# Patient Record
Sex: Male | Born: 1945 | Race: White | Hispanic: No | Marital: Married | Smoking: Former smoker
Health system: Southern US, Community
[De-identification: ages and names within clinical notes are randomized; demographics above are authoritative.]

## PROBLEM LIST (undated history)

## (undated) DIAGNOSIS — I639 Cerebral infarction, unspecified: Secondary | ICD-10-CM

## (undated) DIAGNOSIS — G709 Myoneural disorder, unspecified: Secondary | ICD-10-CM

## (undated) DIAGNOSIS — H9319 Tinnitus, unspecified ear: Secondary | ICD-10-CM

## (undated) DIAGNOSIS — IMO0002 Reserved for concepts with insufficient information to code with codable children: Secondary | ICD-10-CM

## (undated) DIAGNOSIS — K219 Gastro-esophageal reflux disease without esophagitis: Secondary | ICD-10-CM

## (undated) DIAGNOSIS — M1990 Unspecified osteoarthritis, unspecified site: Secondary | ICD-10-CM

## (undated) DIAGNOSIS — F419 Anxiety disorder, unspecified: Secondary | ICD-10-CM

## (undated) HISTORY — DX: Unspecified osteoarthritis, unspecified site: M19.90

## (undated) HISTORY — DX: Gastro-esophageal reflux disease without esophagitis: K21.9

## (undated) HISTORY — DX: Cerebral infarction, unspecified: I63.9

## (undated) HISTORY — DX: Myoneural disorder, unspecified: G70.9

## (undated) HISTORY — DX: Reserved for concepts with insufficient information to code with codable children: IMO0002

## (undated) HISTORY — DX: Anxiety disorder, unspecified: F41.9

---

## 2000-06-01 ENCOUNTER — Ambulatory Visit (HOSPITAL_COMMUNITY): Admission: RE | Admit: 2000-06-01 | Discharge: 2000-06-01 | Payer: Self-pay | Admitting: *Deleted

## 2010-08-24 ENCOUNTER — Encounter (INDEPENDENT_AMBULATORY_CARE_PROVIDER_SITE_OTHER): Payer: Medicare Other

## 2010-08-24 ENCOUNTER — Encounter (INDEPENDENT_AMBULATORY_CARE_PROVIDER_SITE_OTHER): Payer: Medicare Other | Admitting: Vascular Surgery

## 2010-08-24 DIAGNOSIS — I83893 Varicose veins of bilateral lower extremities with other complications: Secondary | ICD-10-CM

## 2010-08-24 DIAGNOSIS — R609 Edema, unspecified: Secondary | ICD-10-CM

## 2010-08-24 DIAGNOSIS — M79609 Pain in unspecified limb: Secondary | ICD-10-CM

## 2010-08-25 NOTE — Consult Note (Signed)
NEW PATIENT CONSULTATION  Tyler Robbins, Tyler Robbins DOB:  November 09, 1945                                       08/24/2010 ZOXWR#:[id number]  The patient is a 65 year old male patient referred by Dr. Doristine Counter for bilateral varicose veins, right worse than left.  He had an episode of significant bleeding from a varix in his right ankle in early March which did respond to elevation and compression.  He has been noticing increasing aching, burning and throbbing discomfort in both legs over the last few years associated with varicosities in his left posterior calf and right ankle area.  He has also noticed increased swelling but has had no stasis ulcers, thrombophlebitis or DVT.  He has been wearing long-leg elastic compression stockings (30 mm-40 mm gradient) since the bleeding episode in early March, prescribed by Dr. Doristine Counter.  He also elevates his legs when he can and takes ibuprofen for aggravations. Symptoms have continued to worsen.  CHRONIC MEDICAL PROBLEMS:  History of a small stroke in 2002 affecting his vision slightly, no other known medical problems.  Denies diabetes, hypertension, coronary artery disease, COPD.  SOCIAL HISTORY:  He is married and has 2 children.  Works in Airline pilot.  Has not smoked in 38 years.  Does not use alcohol.  FAMILY HISTORY:  Positive for coronary artery disease in his father, diabetes in his son, and negative for stroke.  REVIEW OF SYSTEMS:  Positive for headaches, arthritis, joint pain, muscle pain, anxiety, reflux esophagitis.  Denies chest pain, dyspnea on exertion.  All other systems are negative in complete review of systems.  PHYSICAL EXAMINATION:  Vital signs:  Blood pressure 143/83, heart rate 68, respirations 24.  General:  He is a well-developed, well-nourished male in no apparent distress, alert and oriented x3.  HEENT:  Normal for age.  EOMs intact.  Lungs:  Clear to auscultation.  No rhonchi or wheezing.  Cardiovascular:   Regular rhythm.  No murmurs.  Carotid pulses 3+, no bruits.  Abdomen:  Soft, nontender, with no masses. Musculoskeletal:  Free of major deformities.  Neurologic:  Normal. Skin:  Evidence of 2 small eschars over prominent reticular veins adjacent to the right medial malleolus where the bleeding occurred.  She has diffuse hyperpigmentation and reticular and small spider veins in the lower third of the leg, particularly around the ankle area.  There is 1+ edema.  Left leg has a bulging varicosity in the posterior calf communicating with the great saphenous system just below the knee with 1+ edema.  Today I ordered bilateral venous duplex exam, which I reviewed and interpreted and confirmed with a bedside Sono-Site ultrasound study.  He has no DVT bilaterally.  Left leg has gross reflux in the great saphenous system from the saphenofemoral junction to near the knee.  The right leg has gross reflux in the right small saphenous system from the ankle communicating with the area of bleeding up to the saphenopopliteal junction.  This patient has failed conservative treatment, has had bleeding and should have the following procedures performed: 1. Laser ablation of the right small saphenous vein with sclerotherapy     to the area of the bleeding site. 2. Laser ablation of the left great saphenous vein with stab     phlebectomies, approximately 10, in the left calf.  We will proceed with this in the near future.    Tyler Robbins  Tyler Robbins, M.D. Electronically Signed  JDL/MEDQ  D:  08/24/2010  T:  08/25/2010  Job:  [id number]

## 2010-09-03 NOTE — Procedures (Unsigned)
LOWER EXTREMITY VENOUS REFLUX EXAM  INDICATION:  Bilateral varicose veins, pain, and edema.  EXAM:  Using color-flow imaging and pulse Doppler spectral analysis, the bilateral common femoral, superficial femoral, popliteal, posterior tibial, greater and lesser saphenous veins are evaluated.  There is evidence suggesting deep venous insufficiency in the left common femoral vein of the lower extremity.  The bilateral saphenofemoral junctions appear competent. The right GSV is competent.  The left GSV is not competent with reflux of >573milliseconds with the caliber as described below.  The bilateral small saphenous veins demonstrate incompetency.  The right small saphenous vein diameters range from 0.36 cm to 0.60 cm.  The left small saphenous vein diameters range from 0.23 cm to 0.50 cm.  GSV Diameter (used if found to be incompetent only)                                           Right    Left Proximal Greater Saphenous Vein           cm       0.84 cm Proximal-to-mid-thigh                     cm       0.45 cm Mid thigh                                 cm       0.44 cm Mid-distal thigh                          cm       cm Distal thigh                              cm       0.21 cm Knee                                      cm       0.39 cm  IMPRESSION: 1. The right great saphenous vein is competent. 2. The left great saphenous vein is not competent with reflux >500     milliseconds. 3. The bilateral great saphenous veins are tortuous. 4. The deep venous system of the left lower extremity is not competent     with reflux >500 milliseconds. 5. The bilateral small saphenous veins are not competent with Reflux     of >523milliseconds. 6. An incompetent perforator is noted in the right distal calf     measuring 0.48 cm.       ___________________________________________ Quita Skye. Hart Rochester, M.D.  SH/MEDQ  D:  08/24/2010  T:  08/24/2010  Job:  [id number]

## 2010-09-14 ENCOUNTER — Encounter: Payer: Self-pay | Admitting: *Deleted

## 2010-09-14 ENCOUNTER — Encounter: Payer: Self-pay | Admitting: Vascular Surgery

## 2010-09-20 ENCOUNTER — Other Ambulatory Visit (INDEPENDENT_AMBULATORY_CARE_PROVIDER_SITE_OTHER): Payer: Medicare Other | Admitting: Vascular Surgery

## 2010-09-20 DIAGNOSIS — I83893 Varicose veins of bilateral lower extremities with other complications: Secondary | ICD-10-CM

## 2010-09-20 HISTORY — PX: OTHER SURGICAL HISTORY: SHX169

## 2010-09-21 NOTE — Assessment & Plan Note (Signed)
OFFICE VISIT  MOYANO, Leondro DOB:  1945-08-30                                       09/20/2010 ZOXWR#:[id number]  The patient had laser ablation of his right small saphenous vein under local tumescent anesthesia performed without difficulty.  He tolerated the procedure well.  He also had sclerotherapy of his bleeding site from the varix which bled in the past.  He will return in 1 week for venous duplex exam to confirm closure and will then be scheduled for the contralateral left great saphenous vein.    Quita Skye Hart Rochester, M.D. Electronically Signed  JDL/MEDQ  D:  09/20/2010  T:  09/21/2010  Job:  [id number]

## 2010-09-23 ENCOUNTER — Encounter: Payer: Self-pay | Admitting: Vascular Surgery

## 2010-09-28 ENCOUNTER — Encounter: Payer: Self-pay | Admitting: Vascular Surgery

## 2010-09-28 ENCOUNTER — Encounter (INDEPENDENT_AMBULATORY_CARE_PROVIDER_SITE_OTHER): Payer: Medicare Other

## 2010-09-28 ENCOUNTER — Ambulatory Visit (INDEPENDENT_AMBULATORY_CARE_PROVIDER_SITE_OTHER): Payer: Medicare Other | Admitting: Vascular Surgery

## 2010-09-28 VITALS — BP 148/92 | HR 67 | Resp 20 | Ht 67.5 in | Wt 170.0 lb

## 2010-09-28 DIAGNOSIS — I83893 Varicose veins of bilateral lower extremities with other complications: Secondary | ICD-10-CM

## 2010-09-28 DIAGNOSIS — Z48812 Encounter for surgical aftercare following surgery on the circulatory system: Secondary | ICD-10-CM

## 2010-09-28 DIAGNOSIS — M79609 Pain in unspecified limb: Secondary | ICD-10-CM

## 2010-09-28 NOTE — Progress Notes (Signed)
One week fu laser ablation and sclero R SSV and R ankle

## 2010-09-28 NOTE — Progress Notes (Signed)
Subjective:     Patient ID: Tyler Robbins, male   DOB: Feb 02, 1946, 65 y.o.   MRN: [id number]  HPI patient returns 1 week post laser ablation of the right small saphenous vein for severe reflux in the right small saphenous vein secondary to valvular incompetence. He denies any severe pain in the right calf. He denies any edema in the right ankle. He has had some irritation in the right proximal calf secondary to his elastic compression stockings which he has been wearing. He tolerated the ibuprofen well. He returned to work 2 days following the procedure. He continues to have aching throbbing discomfort in the left leg despite wearing long-leg elastic compression stockings.   Review of Systems     Objective:   Physical Exam On examination today-he has minimal tenderness along the course of the right small saphenous vein. There is no distal edema. He has 3+ dorsalis pedis pulse palpable. The distal varicosities are less prominent on physical exam.chest is clear to auscultation.  Assessment:     Today I ordered a venous duplex exam of the right lower extremity which are reviewed and interpreted. He has no DVT in the right leg. A small saphenous vein has been completely ablated. He has done well following ablation of his right small saphenous vein or painful varicosities secondary to venous hypertension.    Plan:     We will continue wearing his elastic compression stocking in the right leg for 1 week. He will return next week for laser ablation of left great saphenous vein for venous hypertension in that leg. He will take ibuprofen on a when necessary basis.

## 2010-10-04 ENCOUNTER — Encounter: Payer: Self-pay | Admitting: Vascular Surgery

## 2010-10-04 ENCOUNTER — Ambulatory Visit (INDEPENDENT_AMBULATORY_CARE_PROVIDER_SITE_OTHER): Payer: Medicare Other | Admitting: Vascular Surgery

## 2010-10-04 VITALS — BP 131/75 | HR 68 | Resp 20 | Ht 67.5 in | Wt 170.0 lb

## 2010-10-04 DIAGNOSIS — I83893 Varicose veins of bilateral lower extremities with other complications: Secondary | ICD-10-CM

## 2010-10-04 NOTE — Procedures (Unsigned)
DUPLEX DEEP VENOUS EXAM - LOWER EXTREMITY  INDICATION:  Followup endovenous laser ablation of the right small saphenous vein lower extremity pain.  HISTORY:  Edema:  Yes. Trauma/Surgery:  Right small saphenous vein ablation 09/20/2010. Pain:  Yes. PE:  No. Previous DVT:  No. Anticoagulants:  No. Other:  DUPLEX EXAM:               CFV   SFV   PopV  PTV    GSV               R  L  R  L  R  L  R   L  R  L Thrombosis    o     o     o     o      o Spontaneous   +     +     +     +      + Phasic        +     +     +     +      + Augmentation  +     +     +     +      + Compressible  +     +     +     +      + Competent     +     +     +  Legend:  + - yes  o - no  p - partial  D - decreased  IMPRESSION: 1. Right lower extremity appears patent with no evidence of deep     venous thrombosis or deep venous reflux present. 2. Good post ablation result the length of the right small saphenous     vein ablated. 3. No extension of thrombus is identified at the popliteal junction.   _____________________________ Quita Skye Hart Rochester, M.D.  SH/MEDQ  D:  09/28/2010  T:  09/28/2010  Job:  [id number]

## 2010-10-04 NOTE — Progress Notes (Signed)
LASER ABLATION OF LEFT GSV

## 2010-10-04 NOTE — Progress Notes (Signed)
Subjective:     Patient ID: Tyler Robbins, male   DOB: 1946-01-27, 65 y.o.   MRN: [id number]  HPI   Review of SystemsThis patient had laser ablation of his left great saphenous vein performed under local tumescent anesthesia. This was done for venous hypertension secondary to valvular incompetence of the left greater saphenous system with painful symptoms. 73 J were used during the treatment session. He will return in one week for a venous duplex exam to confirm closure of the left great saphenous vein. He tolerated the procedure well today.    Objective:   Physical Exam     Assessment:       Plan:

## 2010-10-12 ENCOUNTER — Ambulatory Visit (INDEPENDENT_AMBULATORY_CARE_PROVIDER_SITE_OTHER): Payer: Medicare Other | Admitting: Vascular Surgery

## 2010-10-12 ENCOUNTER — Encounter (INDEPENDENT_AMBULATORY_CARE_PROVIDER_SITE_OTHER): Payer: Medicare Other

## 2010-10-12 VITALS — BP 159/85 | HR 64 | Resp 20 | Ht 67.5 in | Wt 170.0 lb

## 2010-10-12 DIAGNOSIS — I83893 Varicose veins of bilateral lower extremities with other complications: Secondary | ICD-10-CM

## 2010-10-12 DIAGNOSIS — Z48812 Encounter for surgical aftercare following surgery on the circulatory system: Secondary | ICD-10-CM

## 2010-10-12 DIAGNOSIS — I8310 Varicose veins of unspecified lower extremity with inflammation: Secondary | ICD-10-CM

## 2010-10-12 NOTE — Progress Notes (Signed)
Subjective:     Patient ID: Tyler Robbins, male   DOB: January 11, 1946, 65 y.o.   MRN: [id number]  HPI this patient returns 1 week post laser ablation of left great saphenous vein for pain and swelling in the left leg secondary to valvular incompetence and reflux in the left great saphenous system. He tolerated the procedure well. He has been wearing his long-leg elastic compression stocking and taking ibuprofen as prescribed. He has used ice packs to decrease the discomfort in the left periodically. He has had no distal edema since the procedure. His right leg also continues to get along well having had ablation of the right small saphenous vein on 09/20/2010.   Review of Systems denies chest pain dyspnea on exertion PND orthopnea progressive edema or other new symptoms.     Objective:   Physical Exam blood pressure 159/85 heart rate 64 respirations 20 Gen. he is alert and oriented x3 and in no apparent distress Chest is clear no rhonchi or wheezing Cardiovascular exam regular rhythm no murmurs. exam reveals 3+ femoral popliteal and dorsalis pedis pulses palpable bilaterally There's mild discomfort along the course of the left great saphenous vein from the knee to the saphenofemoral junction. There is no erythema or blistering of the skin. There is no distal edema. Both feet are well-perfused.  Today I ordered a venous duplex exam of the left leg which are reviewed and interpreted. There is no DVT. The left great saphenous vein is thrombosed from the SF GIA to the distal insertion site in the distal thigh.    Assessment:     Doing well post laser ablation of the left great saphenous vein and the right small saphenous vein with sclerotherapy of the bleeding site in the right ankle.    Plan:     He'll return to see [place] on a when necessary basis

## 2010-10-12 NOTE — Progress Notes (Signed)
One week fu L GSV laser ablation on 10/04/10

## 2010-10-25 NOTE — Procedures (Unsigned)
DUPLEX DEEP VENOUS EXAM - LOWER EXTREMITY  INDICATION:  Follow up left GSV ablation.  HISTORY:  Edema:  Yes. Trauma/Surgery:  Left GSV ablation, 10/04/2010, by Dr. Hart Rochester. Pain:  Tenderness. PE:  No. Previous DVT:  No. Anticoagulants:  No. Other:  DUPLEX EXAM:               CFV   SFV   PopV  PTV    GSV               R  L  R  L  R  L  R   L  R  L Thrombosis    o  o     o     o      o     + Spontaneous   +  +     +     +      +     o Phasic        +  +     +     +      +     o Augmentation  +  +     +     +      +     o Compressible  +  +     +     +      +     o Competent     +  o     o     o      +     +  Legend:  + - yes  o - no  p - partial  D - decreased  IMPRESSION: 1. No evidence of left lower extremity deep venous thrombosis. 2. The left greater saphenous vein is thrombosed from the     saphenofemoral junction to the distal insertion site.   _____________________________ Quita Skye. Hart Rochester, M.D.  EM/MEDQ  D:  10/12/2010  T:  10/12/2010  Job:  [id number]

## 2011-09-20 ENCOUNTER — Telehealth: Payer: Self-pay | Admitting: *Deleted

## 2011-09-20 NOTE — Telephone Encounter (Signed)
Mr. Tootle states 3 days ago when he was running to get out of rain he felt a pulling sensation in the right leg "in the crease behind my knee."  He states he experienced swelling in the right popliteal area but has not experienced any pain.  He is s/p endovenous laser ablation 09-21-2010 right small saphenous vein.  He has been wearing thigh high compression hose, elevating the right leg, and using ice to right popliteal area with reduction in swelling noted.  Encouraged Mr. Jurgens to use Ibuprofen 600 mg prn and to add an Ace wrap to area behind right knee for added support and compression. Encouraged Mr. Fikes to call if symptoms worsen or for any further questions or concerns.  Mr. Scheier verbalized understanding.  Rankin, Neena Rhymes

## 2011-09-22 ENCOUNTER — Telehealth: Payer: Self-pay | Admitting: *Deleted

## 2011-09-22 NOTE — Telephone Encounter (Signed)
Tyler Robbins walked into [facility] with questions about swelling behind right knee. He is s/p endovenous laser ablation 09-28-2011 right small saphenous vein.  Tyler Robbins states 3-4 days ago he ran suddenly to get out of the rain and felt a pulling sensation behind his knee and noticed swelling behind the right knee. He has experienced no pain, tenderness, or redness in the swollen area and has experienced no pain or swelling in the right ankle or foot. I spoke with him by telephone on 07-16-2-13 and he has implemented the recommendations I gave him: he states he has been wearing thigh high compression hose, elevating right leg, and using ice pack to affected site.   He states he has noticed reduction in the area of swelling over the past couple of days.  I observed soft tissue swelling lateral aspect of right posterior knee ~2inches long (length) by 1 1/2 inches in width. The area is non tender with no redness observed.  Recommended to Tyler Robbins that he continue to wear his thigh high compression hose, elevate his right leg, use ice pack to right posterior knee as needed.  Tyler Robbins verbalized understanding and appears to be reassured. Tyler Robbins called 09-22-2011 at 1:05P stating he could not remember our discussion when he was in [facility] 2 hours earlier and needed to explain it to his wife.  I again explained my observations and recommendations.  Tyler Robbins verbalized understanding and appears reassured.  Tyler Robbins will call [facility] if he has further questions or if he experiences severe pain and increased swelling in the right leg.  Tyler Robbins, Tyler Robbins

## 2013-07-15 ENCOUNTER — Encounter: Payer: Self-pay | Admitting: Vascular Surgery

## 2013-07-16 ENCOUNTER — Ambulatory Visit (INDEPENDENT_AMBULATORY_CARE_PROVIDER_SITE_OTHER): Payer: Medicare Other | Admitting: Vascular Surgery

## 2013-07-16 ENCOUNTER — Encounter: Payer: Self-pay | Admitting: Vascular Surgery

## 2013-07-16 VITALS — BP 138/89 | HR 70 | Ht 67.5 in | Wt 175.0 lb

## 2013-07-16 DIAGNOSIS — I83893 Varicose veins of bilateral lower extremities with other complications: Secondary | ICD-10-CM | POA: Insufficient documentation

## 2013-07-16 NOTE — Progress Notes (Signed)
Subjective:     Patient ID: Tyler Robbins, male   DOB: [birth year], 68 y.o.   MRN: [id number]  HPI this 68 year old male returns having undergone laser ablation of the right small saphenous left great saphenous veins and 2012. He had previous bleeding in the right ankle area prior to his ablation. He's had no recurrent bleeding. He is concerned about some superficial veins that had developed in the right ankle area and wanted that evaluated. He has had some mild edema in the lower third of the right leg but nothing severe. He's had no skin breakdown. He said no problems with contralateral left leg. In general he is pleased with the result.  Past Medical History  Diagnosis Date  . Anxiety   . Arthritis   . GERD (gastroesophageal reflux disease)   . Neuromuscular disorder   . Stroke   . Ulcer     History  Substance Use Topics  . Smoking status: Former Smoker    Quit date: 03/16/1972  . Smokeless tobacco: Former Systems developer  . Alcohol Use: No    Family History  Problem Relation Age of Onset  . Heart disease Father   . Diabetes Son     Allergies  Allergen Reactions  . Ketek [Telithromycin]   . Tussi-12 [Chlorphen Tan-Carbetapent Tan]     Current outpatient prescriptions:aspirin 325 MG tablet, Take 325 mg by mouth once.  , Disp: , Rfl: ;  co-enzyme Q-10 30 MG capsule, Take 30 mg by mouth 3 (three) times daily.  , Disp: , Rfl: ;  Multiple Vitamins-Minerals (MULTIVITAMIN WITH MINERALS) tablet, Take 1 tablet by mouth daily.  , Disp: , Rfl: ;  naproxen sodium (ANAPROX) 220 MG tablet, Take 220 mg by mouth 2 (two) times daily with a meal.  , Disp: , Rfl:  saw palmetto 500 MG capsule, Take 500 mg by mouth daily.  , Disp: , Rfl: ;  triamterene-hydrochlorothiazide (DYAZIDE) 37.5-25 MG per capsule, Take 1 capsule by mouth every morning.  , Disp: , Rfl:   BP 138/89  Pulse 70  Ht 5' 7.5" (1.715 m)  Wt 175 lb (79.379 kg)  BMI 26.99 kg/m2  SpO2 96%  Body mass index is 26.99  kg/(m^2).          Review of Systems denies chest pain, dyspnea on exertion, PND, orthopnea.     Objective:   Physical Exam BP 138/89  Pulse 70  Ht 5' 7.5" (1.715 m)  Wt 175 lb (79.379 kg)  BMI 26.99 kg/m2  SpO2 96%  General well-developed well-nourished male no apparent stress alert and oriented x3 Right leg 3+ femoral and dorsalis pedis pulse palpable. Some superficial spider and reticular veins are present in the right ankle area. No evidence of skin breakdown or bleeding. No cellulitis noted. No bulging varicosities noted. He does have some spider veins in the left posterior thigh and posterior calf which are moderately extensive and he is interested in possible sclerotherapy.      Assessment:     2 years status post laser ablation right small saphenous left great saphenous veins for varicose veins and previous bleeding in the right ankle. Good result. Spider veins left posterior thigh and calf-asymptomatic    Plan:     No further workup for right lower extremity. Patient may be interested in sclerotherapy left leg if so he will contact [facility] Will notify us if he develops any recurrent bleeding and right ankle or skin breakdown or worsening of edema.

## 2013-08-13 ENCOUNTER — Encounter: Payer: Self-pay | Admitting: *Deleted

## 2013-08-14 ENCOUNTER — Ambulatory Visit (INDEPENDENT_AMBULATORY_CARE_PROVIDER_SITE_OTHER): Payer: Self-pay | Admitting: *Deleted

## 2013-08-14 DIAGNOSIS — I781 Nevus, non-neoplastic: Secondary | ICD-10-CM

## 2013-08-14 NOTE — Progress Notes (Signed)
X=.3% Sotradecol administered with a 27g butterfly.  Patient received a total of 6cc.     Photos: yes  Compression stockings applied: yes    Only able to treat the right ankle both sides and the back of the left knee with one syringe. Easy access. Tol well. Will need more to get rest of vessels. Follow prn.

## 2013-08-15 ENCOUNTER — Encounter: Payer: Self-pay | Admitting: Vascular Surgery

## 2015-05-14 DIAGNOSIS — F419 Anxiety disorder, unspecified: Secondary | ICD-10-CM | POA: Insufficient documentation

## 2015-05-14 DIAGNOSIS — N400 Enlarged prostate without lower urinary tract symptoms: Secondary | ICD-10-CM | POA: Insufficient documentation

## 2015-05-14 DIAGNOSIS — M19019 Primary osteoarthritis, unspecified shoulder: Secondary | ICD-10-CM | POA: Insufficient documentation

## 2015-05-14 DIAGNOSIS — M722 Plantar fascial fibromatosis: Secondary | ICD-10-CM | POA: Insufficient documentation

## 2015-07-01 DIAGNOSIS — Z0000 Encounter for general adult medical examination without abnormal findings: Secondary | ICD-10-CM | POA: Insufficient documentation

## 2015-09-11 ENCOUNTER — Telehealth: Payer: Self-pay | Admitting: *Deleted

## 2015-09-11 NOTE — Telephone Encounter (Signed)
Tyler Robbins walked into to [facility] yesterday and wanted to be assessed without an appointment.  Front desk staff explained that [facility]  has NO WALK IN policy and offered him an appointment with Dr. Kellie Simmering which Tyler Robbins declined.  Following  up,  I left an telephone voice message for Tyler Robbins to call me on Tuesday(09-15-2015)  as I am not in the office this afternoon and Monday.

## 2015-09-15 ENCOUNTER — Telehealth: Payer: Self-pay | Admitting: *Deleted

## 2015-09-15 NOTE — Telephone Encounter (Signed)
Following up on telephone messages Mr. Meredith left yesterday.  Left detailed voice message on Mr. Erhard telephone voice mail that Dr. Donnetta Hutching and Dr. Kellie Simmering were both booked today and that he would need to make an appointment with Dr. Kellie Simmering and gave him the main line to reach the [facility] schedulers.  Advised him of [facility] policy that we do not accept walk-ins and that he needs to make an appointment with Dr. Kellie Simmering.  Reviewed with him the steps to follow in case of LE varicosity bleeding. Mr. Enloe had indicated in earlier voice message that he had varicose vein bleed on 09-07-2015 and that he had stopped the bleeding and that it has not rebled since.

## 2015-09-15 NOTE — Telephone Encounter (Signed)
Mr. Yanak called responding to my telephone voice message to him at 8:40AM this morning. Mr. Schlotter states he has had no reoccurence of LE varicosity bleeding.  Original episode was on 09-07-2015 after he showered and rubbed his leg vigorously with towel.  He states he has not showered and has kept on bandaide on since that time.  Reviewed with Mr. Halliday procedure for stopping bleeding if it should reoccur.    Offered for [facility] schedulers to make appointment to see Dr. Kellie Simmering for him.  Reviewed [facility] policy of no walk-in evaluations after he inquired again.  Mr. Pullian to call [facility] if he has further questions or concerns or if he wants to make an appointment to see Dr. Kellie Simmering.

## 2015-09-17 ENCOUNTER — Encounter: Payer: Self-pay | Admitting: Vascular Surgery

## 2015-09-21 ENCOUNTER — Ambulatory Visit: Payer: Self-pay | Admitting: Vascular Surgery

## 2015-09-21 ENCOUNTER — Ambulatory Visit (INDEPENDENT_AMBULATORY_CARE_PROVIDER_SITE_OTHER): Payer: Medicare Other | Admitting: Vascular Surgery

## 2015-09-21 VITALS — BP 142/83 | HR 69 | Temp 98.0°F | Resp 16 | Ht 67.5 in | Wt 175.0 lb

## 2015-09-21 DIAGNOSIS — I8393 Asymptomatic varicose veins of bilateral lower extremities: Secondary | ICD-10-CM | POA: Diagnosis not present

## 2015-09-21 NOTE — Progress Notes (Signed)
Subjective:     Patient ID: Tyler Robbins, male   DOB: 02-Jan-1946, 70 y.o.   MRN: [id number]  HPI this 70 year old male is known to me having undergone previous laser ablation left great saphenous vein and right small saphenous vein as well as sclerotherapy for bleeding episode in the right ankle 2 years ago. He is done well since that time with no bleeding. He has no swelling in the legs. He has been pleased with his result. 2 weeks ago while drying off after a shower he began having bleeding from a small varicosity adjacent to the right ankle area. This required compression for several minutes while lying on his back to stop the bleeding. He has had a dressing on this area since then with no recurrent bleeding. He's had no symptoms in the left leg. He has been wearing elastic compression stockings.  Past Medical History  Diagnosis Date  . Anxiety   . Arthritis   . GERD (gastroesophageal reflux disease)   . Neuromuscular disorder   . Stroke   . Ulcer     Social History  Substance Use Topics  . Smoking status: Former Smoker    Quit date: 03/16/1972  . Smokeless tobacco: Former Systems developer  . Alcohol Use: No    Family History  Problem Relation Age of Onset  . Heart disease Father   . Diabetes Son     Allergies  Allergen Reactions  . Ketek [Telithromycin]   . Tussi-12 [Chlorphen Tan-Carbetapent Tan]      Current outpatient prescriptions:  .  aspirin 325 MG tablet, Take 325 mg by mouth once.  , Disp: , Rfl:  .  co-enzyme Q-10 30 MG capsule, Take 30 mg by mouth 3 (three) times daily.  , Disp: , Rfl:  .  Multiple Vitamins-Minerals (MULTIVITAMIN WITH MINERALS) tablet, Take 1 tablet by mouth daily.  , Disp: , Rfl:  .  naproxen sodium (ANAPROX) 220 MG tablet, Take 220 mg by mouth 2 (two) times daily with a meal.  , Disp: , Rfl:  .  saw palmetto 500 MG capsule, Take 500 mg by mouth daily.  , Disp: , Rfl:  .  triamterene-hydrochlorothiazide (DYAZIDE) 37.5-25 MG per capsule, Take 1 capsule  by mouth every morning.  , Disp: , Rfl:   Filed Vitals:   09/21/15 0902 09/21/15 0903  BP: 146/85 142/83  Pulse: 69   Temp: 98 F (36.7 C)   Resp: 16   Height: 5' 7.5" (1.715 m)   Weight: 175 lb (79.379 kg)   SpO2: 100%     Body mass index is 26.99 kg/(m^2).         Review of Systems chest pain, dyspnea on exertion, PND, orthopnea, hemoptysis, claudication     Objective:   Physical Exam BP 142/83 mmHg  Pulse 69  Temp(Src) 98 F (36.7 C)  Resp 16  Ht 5' 7.5" (1.715 m)  Wt 175 lb (79.379 kg)  BMI 26.99 kg/m2  SpO2 100%  Gen. well-developed well-nourished male no apparent distress alert and oriented 3 Lungs no rhonchi or wheezing Right leg with reticular veins and some hyperpigmentation lower third with eschar over the bleeding site adjacent to the medial malleolus. 3+ dorsalis pedis pulse palpable. No distal edema noted.     Assessment:     Episode of bleeding from superficial varicosity right ankle 2 weeks ago with no recurrent bleeding History of laser ablation right small saphenous vein with previous bleeding from an area adjacent to this  Plan:     Patient needs sclerotherapy of the bleeding site to prevent further bleeding episodes We'll schedule that in the near future

## 2015-09-21 NOTE — Progress Notes (Signed)
Filed Vitals:   09/21/15 0902 09/21/15 0903  BP: 146/85 142/83  Pulse: 69   Temp: 98 F (36.7 C)   Resp: 16   Height: 5' 7.5" (1.715 m)   Weight: 175 lb (79.379 kg)   SpO2: 100%

## 2015-09-22 ENCOUNTER — Ambulatory Visit (INDEPENDENT_AMBULATORY_CARE_PROVIDER_SITE_OTHER): Payer: Medicare Other | Admitting: *Deleted

## 2015-09-22 DIAGNOSIS — I8393 Asymptomatic varicose veins of bilateral lower extremities: Secondary | ICD-10-CM

## 2015-09-22 NOTE — Progress Notes (Signed)
Pt received 3% Sortadecol, a total of 6 cc foam with Co2 to the bleed site on his inner right ankle area. Treated other areas that looked like potential bleed areas. Easy access. Tol well. Follow prn. Compression stockings applied to both legs.

## 2015-09-23 ENCOUNTER — Encounter: Payer: Self-pay | Admitting: Vascular Surgery

## 2015-09-25 DIAGNOSIS — L814 Other melanin hyperpigmentation: Secondary | ICD-10-CM | POA: Insufficient documentation

## 2015-09-25 DIAGNOSIS — L820 Inflamed seborrheic keratosis: Secondary | ICD-10-CM | POA: Insufficient documentation

## 2015-09-25 DIAGNOSIS — L570 Actinic keratosis: Secondary | ICD-10-CM | POA: Insufficient documentation

## 2015-10-15 ENCOUNTER — Telehealth: Payer: Self-pay | Admitting: *Deleted

## 2015-10-15 NOTE — Telephone Encounter (Signed)
Attempted to call Tyler Robbins 10-15-2015 at 10:30AM, 1:30PM, and 3:30PM.  Tyler Robbins did not answer his cell phone and voice mail was not set up on his cell phone so I was unable to leave a message.  Attempted at 2:00PM  to call his emergency contact, Kashawn Lauren, at [phone] and phone recording said this number was not a working phone number.

## 2015-10-15 NOTE — Telephone Encounter (Signed)
Mr. Tyler Robbins called stating that his phone had been turned off today accidentally.  Mr. Tyler Robbins is s/p sclerotherapy 09-22-2015 by Thea Silversmith RN.  Mr. Tyler Robbins states in some of the areas that Tyler Robbins treated on his right leg he has "black dots" that look like a scab.  Reassured Mr. Tyler Robbins that the areas he described were within normal limits post sclerotherapy.  Answered Mr. Tyler Robbins questions and reviewed how to stop bleeding if it reoccurred.  Mr. Tyler Robbins verbalized relief and understanding.

## 2016-12-06 ENCOUNTER — Ambulatory Visit
Admission: RE | Admit: 2016-12-06 | Discharge: 2016-12-06 | Disposition: A | Discharge status: Home / Self Care | Payer: Self-pay | Source: Ambulatory Visit | Attending: Physician Assistant | Admitting: Physician Assistant

## 2016-12-06 ENCOUNTER — Other Ambulatory Visit: Payer: Self-pay | Admitting: Physician Assistant

## 2016-12-06 DIAGNOSIS — R52 Pain, unspecified: Secondary | ICD-10-CM

## 2016-12-13 ENCOUNTER — Other Ambulatory Visit: Payer: Self-pay | Admitting: Physician Assistant

## 2016-12-13 ENCOUNTER — Ambulatory Visit
Admission: RE | Admit: 2016-12-13 | Discharge: 2016-12-13 | Disposition: A | Discharge status: Home / Self Care | Payer: Medicare Other | Source: Ambulatory Visit | Attending: Physician Assistant | Admitting: Physician Assistant

## 2016-12-13 DIAGNOSIS — M25511 Pain in right shoulder: Secondary | ICD-10-CM

## 2016-12-13 DIAGNOSIS — M25512 Pain in left shoulder: Principal | ICD-10-CM

## 2017-05-19 ENCOUNTER — Other Ambulatory Visit: Payer: Self-pay

## 2017-05-19 ENCOUNTER — Emergency Department (HOSPITAL_BASED_OUTPATIENT_CLINIC_OR_DEPARTMENT_OTHER)
Admission: EM | Admit: 2017-05-19 | Discharge: 2017-05-19 | Discharge status: Absent without leave | Payer: Medicare Other | Attending: Emergency Medicine | Admitting: Emergency Medicine

## 2017-05-19 ENCOUNTER — Other Ambulatory Visit: Payer: Self-pay | Admitting: Physician Assistant

## 2017-05-19 DIAGNOSIS — R1032 Left lower quadrant pain: Secondary | ICD-10-CM

## 2017-05-19 NOTE — ED Triage Notes (Signed)
Patient states that he is having left sided flank and abdominal pain x 2 days

## 2017-05-19 NOTE — ED Notes (Signed)
Patient has an appointment for an outpatient CT  - the patient does not want to stay for a full er visit because he was not supposed to be here for ER visit

## 2017-06-01 ENCOUNTER — Other Ambulatory Visit: Payer: Medicare Other

## 2017-06-01 ENCOUNTER — Other Ambulatory Visit: Payer: Self-pay | Admitting: Urology

## 2017-06-01 DIAGNOSIS — N130 Hydronephrosis with ureteropelvic junction obstruction: Secondary | ICD-10-CM

## 2017-06-21 ENCOUNTER — Encounter (HOSPITAL_COMMUNITY)
Admission: RE | Admit: 2017-06-21 | Discharge: 2017-06-21 | Disposition: A | Discharge status: Home / Self Care | Payer: Medicare Other | Source: Ambulatory Visit | Attending: Urology | Admitting: Urology

## 2017-06-21 DIAGNOSIS — N130 Hydronephrosis with ureteropelvic junction obstruction: Secondary | ICD-10-CM | POA: Diagnosis not present

## 2017-06-21 MED ORDER — FUROSEMIDE 10 MG/ML IJ SOLN
INTRAMUSCULAR | Status: AC
Start: 2017-06-21 — End: 2017-06-21
  Filled 2017-06-21: qty 4

## 2017-06-21 MED ORDER — FUROSEMIDE 10 MG/ML IJ SOLN
36.0000 mg | Freq: Once | INTRAMUSCULAR | Status: AC
  Administered 2017-06-21: 36 mg via INTRAVENOUS

## 2017-06-21 MED ORDER — TECHNETIUM TC 99M MERTIATIDE
5.2000 | Freq: Once | INTRAVENOUS | Status: AC | PRN
  Administered 2017-06-21: 5.2 via INTRAVENOUS

## 2017-12-12 ENCOUNTER — Other Ambulatory Visit (HOSPITAL_COMMUNITY): Payer: Self-pay | Admitting: Urology

## 2017-12-12 DIAGNOSIS — Q6211 Congenital occlusion of ureteropelvic junction: Principal | ICD-10-CM

## 2017-12-12 DIAGNOSIS — Q6239 Other obstructive defects of renal pelvis and ureter: Secondary | ICD-10-CM

## 2017-12-25 ENCOUNTER — Encounter (HOSPITAL_COMMUNITY)
Admission: RE | Admit: 2017-12-25 | Discharge: 2017-12-25 | Disposition: A | Discharge status: Home / Self Care | Payer: Medicare Other | Source: Ambulatory Visit | Attending: Urology | Admitting: Urology

## 2017-12-25 DIAGNOSIS — Q6211 Congenital occlusion of ureteropelvic junction: Secondary | ICD-10-CM | POA: Diagnosis not present

## 2017-12-25 DIAGNOSIS — Q6239 Other obstructive defects of renal pelvis and ureter: Secondary | ICD-10-CM

## 2017-12-25 MED ORDER — TECHNETIUM TC 99M MERTIATIDE
4.9400 | Freq: Once | INTRAVENOUS | Status: AC
  Administered 2017-12-25: 4.94 via INTRAVENOUS

## 2017-12-25 MED ORDER — FUROSEMIDE 10 MG/ML IJ SOLN
INTRAMUSCULAR | Status: AC
  Filled 2017-12-25: qty 4

## 2017-12-25 MED ORDER — FUROSEMIDE 10 MG/ML IJ SOLN
36.0000 mg | Freq: Once | INTRAMUSCULAR | Status: AC
  Administered 2017-12-25: 36 mg via INTRAVENOUS

## 2018-09-06 DIAGNOSIS — R3912 Poor urinary stream: Secondary | ICD-10-CM | POA: Insufficient documentation

## 2018-09-06 DIAGNOSIS — R739 Hyperglycemia, unspecified: Secondary | ICD-10-CM | POA: Insufficient documentation

## 2018-09-06 DIAGNOSIS — N401 Enlarged prostate with lower urinary tract symptoms: Secondary | ICD-10-CM | POA: Insufficient documentation

## 2018-09-06 DIAGNOSIS — D485 Neoplasm of uncertain behavior of skin: Secondary | ICD-10-CM | POA: Insufficient documentation

## 2018-09-20 ENCOUNTER — Other Ambulatory Visit: Payer: Self-pay

## 2018-09-20 DIAGNOSIS — I8393 Asymptomatic varicose veins of bilateral lower extremities: Secondary | ICD-10-CM

## 2018-09-27 ENCOUNTER — Encounter (HOSPITAL_COMMUNITY): Payer: Medicare Other

## 2018-09-27 ENCOUNTER — Encounter: Payer: Medicare Other | Admitting: Vascular Surgery

## 2018-10-09 ENCOUNTER — Other Ambulatory Visit: Payer: Self-pay | Admitting: Urology

## 2018-10-09 DIAGNOSIS — Q6239 Other obstructive defects of renal pelvis and ureter: Secondary | ICD-10-CM

## 2018-10-26 IMAGING — NM NM RENAL IMAGING FLOW W/ PHARM
2 series · 12 of 12 positions shown · non-contrast
Comparison: 06/21/2017

Correlation: CT abdomen and pelvis 05/22/2017

CLINICAL DATA: Congenital UPJ obstruction

EXAM:
NUCLEAR MEDICINE RENAL SCAN WITH DIURETIC ADMINISTRATION
TECHNIQUE: Radionuclide angiographic and sequential renal images were obtained
after intravenous injection of radiopharmaceutical. Imaging was
continued during slow intravenous injection of Lasix approximately
15 minutes after the start of the examination.
RADIOPHARMACEUTICALS:  4.94 mCi Zechnetium-99m MAG3 IV
Pharmaceutical: Lasix 36 mg IV 20 minutes into imaging

[Series 1: renal scan · 4.14mm/px · 6 of 40 frames shown (1 of 2)]
[frame 4/40  full-range]
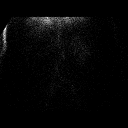
[frame 10/40  full-range]
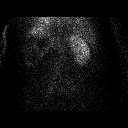
[frame 17/40  full-range]
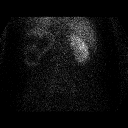
[frame 24/40  full-range]
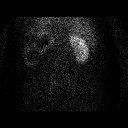
[frame 30/40  full-range]
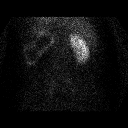
[frame 37/40  full-range]
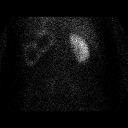

[Series 1: renal scan · 4.14mm/px · 6 of 79 frames shown (2 of 2)]
[frame 7/79]
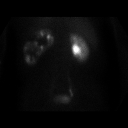
[frame 20/79]
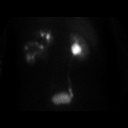
[frame 33/79]
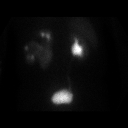
[frame 46/79]
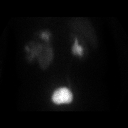
[frame 59/79]
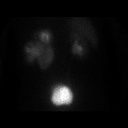
[frame 73/79]
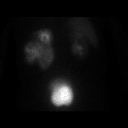

[12 of 12 positions shown; findings below may reference images not displayed]

FINDINGS: Flow: Prompt arterial flow to RIGHT kidney. Delayed and diminished
flow to LEFT kidney

Left renogram: Marked cortical thinning. Enlargement of LEFT kidney
versus RIGHT. Delayed uptake, concentration and excretion of tracer
into a markedly dilated LEFT collecting system. Poor clearance of
tracer by the LEFT kidney. Significant retained tracer within the
dilated LEFT renal collecting system at the conclusion of the exam.
No accelerated clearance of tracer is seen following Lasix. Delayed
time to peak activity of 12.2 minutes. Failure to fall to half
maximum activity by the conclusion of the exam.

Right renogram: Normal uptake, concentration and excretion of tracer
by RIGHT kidney. Tracer is excreted into a minimally dilated RIGHT
renal collecting system. Suboptimal clearance prior to Lasix but
accelerated clearance of tracer by the RIGHT kidney following
diuretic administration. Normal time to peak activity of 4.7 minutes
with fall to half maximum activity 24.2 minutes later.

Differential:

Left kidney = 31 %

Right kidney = 69 %

T1/2 post Lasix :

Left kidney = N/A min

Right kidney = 10.5 min
IMPRESSION: Minimally dilated RIGHT renal collecting system without evidence of
urinary outflow obstruction.

Markedly dilated LEFT renal collecting system associated with
cortical thinning of the LEFT kidney.

Impaired clearance of tracer from the dilated LEFT renal collecting
system despite Lasix administration consistent with persistent
urinary outflow obstruction.

## 2018-11-01 ENCOUNTER — Ambulatory Visit: Payer: Medicare Other | Admitting: Vascular Surgery

## 2018-11-01 ENCOUNTER — Encounter: Payer: Self-pay | Admitting: Vascular Surgery

## 2018-11-01 ENCOUNTER — Other Ambulatory Visit: Payer: Self-pay

## 2018-11-01 ENCOUNTER — Ambulatory Visit (HOSPITAL_COMMUNITY)
Admission: RE | Admit: 2018-11-01 | Discharge: 2018-11-01 | Disposition: A | Discharge status: Home / Self Care | Payer: Medicare Other | Source: Ambulatory Visit | Attending: Vascular Surgery | Admitting: Vascular Surgery

## 2018-11-01 VITALS — BP 131/79 | HR 71 | Temp 97.5°F | Resp 16 | Ht 67.0 in | Wt 162.0 lb

## 2018-11-01 DIAGNOSIS — I8393 Asymptomatic varicose veins of bilateral lower extremities: Secondary | ICD-10-CM | POA: Diagnosis not present

## 2018-11-01 DIAGNOSIS — I83813 Varicose veins of bilateral lower extremities with pain: Secondary | ICD-10-CM

## 2018-11-01 NOTE — Progress Notes (Signed)
REASON FOR CONSULT:    Varicose veins.  The consult is requested by Dr. Kathryne Eriksson.  ASSESSMENT & PLAN:   CHRONIC VENOUS INSUFFICIENCY: This patient does have some deep venous reflux on the right and also some superficial venous reflux in the small saphenous vein on the left.  However he is asymptomatic.  He also has a spider veins which have not bled since he had sclerotherapy over 3 years ago at our office. He has CEAP C4c venous disease.  We have discussed the importance of intermittent leg elevation the proper positioning for this.  I offered to give him a prescription for compression stockings but he did not wish to have this as he does not like wearing compression stockings and says that he already has some.  I have encouraged him to avoid prolonged sitting and standing.  We discussed the importance of exercise specifically walking and water aerobics.  Currently he does not need any further work-up of his venous disease.  He is not a candidate for laser ablation.  I will be happy to see him back at any time if he develops new symptoms.  Deitra Mayo, MD, FACS Beeper [phone] Office: [phone]   HPI:   Tyler Robbins is a pleasant 73 y.o. male, who was referred with varicose veins.  I have reviewed the records from the referring office.  The patient was seen on 09/06/2018.  The patient was noted to have varicose veins of both lower extremities and was sent for vascular consultation.  Of note, the patient was seen over 3 years ago by Dr. Kellie Simmering after he had a bleeding episode from a varicose vein on his right ankle.  The patient had undergone previous laser ablation of left great saphenous vein and right small saphenous vein as well as sclerotherapy for the bleeding episode.  Currently he denies any symptoms related to his varicose veins.  He denies aching pain or heaviness.  He has no previous history of DVT or phlebitis.  He does elevate his legs some during the day.  He does not  like to wear compression stockings.  He is very active.  Past Medical History:  Diagnosis Date  . Anxiety   . Arthritis   . GERD (gastroesophageal reflux disease)   . Neuromuscular disorder   . Stroke   . Ulcer     Family History  Problem Relation Age of Onset  . Heart disease Father   . Diabetes Son     SOCIAL HISTORY: Social History   Socioeconomic History  . Marital status: Married    Spouse name: Not on file  . Number of children: Not on file  . Years of education: Not on file  . Highest education level: Not on file  Occupational History  . Not on file  Social Needs  . Financial resource strain: Not on file  . Food insecurity    Worry: Not on file    Inability: Not on file  . Transportation needs    Medical: Not on file    Non-medical: Not on file  Tobacco Use  . Smoking status: Former Smoker    Quit date: 03/16/1972    Years since quitting: 46.6  . Smokeless tobacco: Former Network engineer and Sexual Activity  . Alcohol use: No  . Drug use: Not on file  . Sexual activity: Not on file  Lifestyle  . Physical activity    Days per week: Not on file    Minutes per session:  Not on file  . Stress: Not on file  Relationships  . Social Herbalist on phone: Not on file    Gets together: Not on file    Attends religious service: Not on file    Active member of club or organization: Not on file    Attends meetings of clubs or organizations: Not on file    Relationship status: Not on file  . Intimate partner violence    Fear of current or ex partner: Not on file    Emotionally abused: Not on file    Physically abused: Not on file    Forced sexual activity: Not on file  Other Topics Concern  . Not on file  Social History Narrative  . Not on file    Allergies  Allergen Reactions  . Ketek [Telithromycin]   . Tussi-12 [Chlorphen Tan-Carbetapent Tan]     Current Outpatient Medications  Medication Sig Dispense Refill  . aspirin 325 MG tablet  Take 81 mg by mouth daily.     . Multiple Vitamins-Minerals (MULTIVITAMIN WITH MINERALS) tablet Take 1 tablet by mouth daily.      . Tamsulosin HCl (FLOMAX PO) Take by mouth daily.    Marland Kitchen co-enzyme Q-10 30 MG capsule Take 30 mg by mouth 3 (three) times daily.      . naproxen sodium (ANAPROX) 220 MG tablet Take 220 mg by mouth 2 (two) times daily with a meal.      . saw palmetto 500 MG capsule Take 500 mg by mouth daily.      Marland Kitchen triamterene-hydrochlorothiazide (DYAZIDE) 37.5-25 MG per capsule Take 1 capsule by mouth every morning.       No current facility-administered medications for this visit.     REVIEW OF SYSTEMS:  [X]  denotes positive finding, [ ]  denotes negative finding Cardiac  Comments:  Chest pain or chest pressure:    Shortness of breath upon exertion:    Short of breath when lying flat:    Irregular heart rhythm:        Vascular    Pain in calf, thigh, or hip brought on by ambulation:    Pain in feet at night that wakes you up from your sleep:     Blood clot in your veins:    Leg swelling:         Pulmonary    Oxygen at home:    Productive cough:     Wheezing:         Neurologic    Sudden weakness in arms or legs:     Sudden numbness in arms or legs:     Sudden onset of difficulty speaking or slurred speech:    Temporary loss of vision in one eye:     Problems with dizziness:         Gastrointestinal    Blood in stool:     Vomited blood:         Genitourinary    Burning when urinating:     Blood in urine: x  microhematuria      Psychiatric    Major depression:         Hematologic    Bleeding problems:    Problems with blood clotting too easily:        Skin    Rashes or ulcers:        Constitutional    Fever or chills:     PHYSICAL EXAM:   Vitals:   11/01/18 1409  BP: 131/79  Pulse: 71  Resp: 16  Temp: (!) 97.5 F (36.4 C)  TempSrc: Temporal  SpO2: 97%  Weight: 162 lb (73.5 kg)  Height: 5\' 7"  (1.702 m)    GENERAL: The patient is a  well-nourished male, in no acute distress. The vital signs are documented above. CARDIAC: There is a regular rate and rhythm.  VASCULAR: I do not detect carotid bruits. He has palpable pedal pulses. VENOUS EXAM: He has corona phlebectasia of both ankles as documented below.        He has some varicose veins along the lateral aspect of his left thigh and left leg.  PULMONARY: There is good air exchange bilaterally without wheezing or rales. ABDOMEN: Soft and non-tender with normal pitched bowel sounds.  MUSCULOSKELETAL: There are no major deformities or cyanosis. NEUROLOGIC: No focal weakness or paresthesias are detected. SKIN: There are no ulcers or rashes noted. PSYCHIATRIC: The patient has a normal affect.  DATA:    VENOUS DUPLEX: I have independently interpreted his venous duplex scan today.  On the right side there is no evidence of DVT.  He does have deep venous reflux involving the common femoral vein and popliteal vein.  There is no significant superficial venous reflux.  On the left side he has no evidence of DVT.  He has deep venous reflux involving the femoral vein.  There is some superficial venous reflux involving the small saphenous vein on the left however the vein is not dilated.

## 2018-12-11 ENCOUNTER — Other Ambulatory Visit: Payer: Self-pay | Admitting: Urology

## 2019-01-07 NOTE — Patient Instructions (Addendum)
DUE TO COVID-19 ONLY ONE VISITOR IS ALLOWED TO COME WITH YOU AND STAY IN THE WAITING ROOM ONLY DURING PRE OP AND PROCEDURE DAY OF SURGERY. THE 1 VISITOR MAY VISIT WITH YOU AFTER SURGERY IN YOUR PRIVATE ROOM DURING VISITING HOURS ONLY!  YOU NEED TO HAVE A COVID 19 TEST ON 01-08-19  @ 2:20  PM, THIS TEST MUST BE DONE BEFORE SURGERY, COME  [place], [place] , [address].  ([place]) ONCE YOUR COVID TEST IS COMPLETED, PLEASE BEGIN THE QUARANTINE INSTRUCTIONS AS OUTLINED IN YOUR HANDOUT.                Tyler Robbins  01/07/2019   Your procedure is scheduled on: 01-11-19    Report to [facility] Main  Entrance    Report to Admitting at 10:30 AM     Call this number if you have problems the morning of surgery [phone]    Remember: Do not eat food or drink liquids :After Midnight.      Take these medicines the morning of surgery with A SIP OF WATER: Tamsulosin HCL (Flomax)  BRUSH YOUR TEETH MORNING OF SURGERY AND RINSE YOUR MOUTH OUT, NO CHEWING GUM CANDY OR MINTS.                                You may not have any metal on your body including hair pins and              piercings     Do not wear jewelry, make-up, lotions, powders or perfumes, deodorant                      Men may shave face and neck.   Do not bring valuables to the hospital. [place].  Contacts, dentures or bridgework may not be worn into surgery.   You may bring an overnight bag     Special Instructions: N/A              Please read over the following fact sheets you were given: _____________________________________________________________________             [facility] - Preparing for Surgery Before surgery, you can play an important role.  Because skin is not sterile, your skin needs to be as free of germs as possible.  You can reduce the number of germs on your skin by washing with CHG (chlorahexidine gluconate)  soap before surgery.  CHG is an antiseptic cleaner which kills germs and bonds with the skin to continue killing germs even after washing. Please DO NOT use if you have an allergy to CHG or antibacterial soaps.  If your skin becomes reddened/irritated stop using the CHG and inform your nurse when you arrive at Short Stay. Do not shave (including legs and underarms) for at least 48 hours prior to the first CHG shower.  You may shave your face/neck. Please follow these instructions carefully:  1.  Shower with CHG Soap the night before surgery and the  morning of Surgery.  2.  If you choose to wash your hair, wash your hair first as usual with your  normal  shampoo.  3.  After you shampoo, rinse your hair and body thoroughly to remove the  shampoo.  4.  Use CHG as you would any other liquid soap.  You can apply chg directly  to the skin and wash                       Gently with a scrungie or clean washcloth.  5.  Apply the CHG Soap to your body ONLY FROM THE NECK DOWN.   Do not use on face/ open                           Wound or open sores. Avoid contact with eyes, ears mouth and genitals (private parts).                       Wash face,  Genitals (private parts) with your normal soap.             6.  Wash thoroughly, paying special attention to the area where your surgery  will be performed.  7.  Thoroughly rinse your body with warm water from the neck down.  8.  DO NOT shower/wash with your normal soap after using and rinsing off  the CHG Soap.                9.  Pat yourself dry with a clean towel.            10.  Wear clean pajamas.            11.  Place clean sheets on your bed the night of your first shower and do not  sleep with pets. Day of Surgery : Do not apply any lotions/deodorants the morning of surgery.  Please wear clean clothes to the hospital/surgery center.  FAILURE TO FOLLOW THESE INSTRUCTIONS MAY RESULT IN THE CANCELLATION OF YOUR SURGERY PATIENT  SIGNATURE_________________________________  NURSE SIGNATURE__________________________________  ________________________________________________________________________  WHAT IS A BLOOD TRANSFUSION? Blood Transfusion Information  A transfusion is the replacement of blood or some of its parts. Blood is made up of multiple cells which provide different functions.  Red blood cells carry oxygen and are used for blood loss replacement.  White blood cells fight against infection.  Platelets control bleeding.  Plasma helps clot blood.  Other blood products are available for specialized needs, such as hemophilia or other clotting disorders. BEFORE THE TRANSFUSION  Who gives blood for transfusions?   Healthy volunteers who are fully evaluated to make sure their blood is safe. This is blood bank blood. Transfusion therapy is the safest it has ever been in the practice of medicine. Before blood is taken from a donor, a complete history is taken to make sure that person has no history of diseases nor engages in risky social behavior (examples are intravenous drug use or sexual activity with multiple partners). The donor's travel history is screened to minimize risk of transmitting infections, such as malaria. The donated blood is tested for signs of infectious diseases, such as HIV and hepatitis. The blood is then tested to be sure it is compatible with you in order to minimize the chance of a transfusion reaction. If you or a relative donates blood, this is often done in anticipation of surgery and is not appropriate for emergency situations. It takes many days to process the donated blood. RISKS AND COMPLICATIONS Although transfusion therapy is very safe and saves many lives, the main dangers of transfusion include:   Getting an infectious disease.  Developing a transfusion reaction. This  is an allergic reaction to something in the blood you were given. Every precaution is taken to prevent  this. The decision to have a blood transfusion has been considered carefully by your caregiver before blood is given. Blood is not given unless the benefits outweigh the risks. AFTER THE TRANSFUSION  Right after receiving a blood transfusion, you will usually feel much better and more energetic. This is especially true if your red blood cells have gotten low (anemic). The transfusion raises the level of the red blood cells which carry oxygen, and this usually causes an energy increase.  The nurse administering the transfusion will monitor you carefully for complications. HOME CARE INSTRUCTIONS  No special instructions are needed after a transfusion. You may find your energy is better. Speak with your caregiver about any limitations on activity for underlying diseases you may have. SEEK MEDICAL CARE IF:   Your condition is not improving after your transfusion.  You develop redness or irritation at the intravenous (IV) site. SEEK IMMEDIATE MEDICAL CARE IF:  Any of the following symptoms occur over the next 12 hours:  Shaking chills.  You have a temperature by mouth above 102 F (38.9 C), not controlled by medicine.  Chest, back, or muscle pain.  People around you feel you are not acting correctly or are confused.  Shortness of breath or difficulty breathing.  Dizziness and fainting.  You get a rash or develop hives.  You have a decrease in urine output.  Your urine turns a dark color or changes to pink, red, or brown. Any of the following symptoms occur over the next 10 days:  You have a temperature by mouth above 102 F (38.9 C), not controlled by medicine.  Shortness of breath.  Weakness after normal activity.  The white part of the eye turns yellow (jaundice).  You have a decrease in the amount of urine or are urinating less often.  Your urine turns a dark color or changes to pink, red, or brown. Document Released: 02/19/2000 Document Revised: 05/16/2011 Document  Reviewed: 10/08/2007 [facility] Patient Information 2014 [place], [place].  _______________________________________________________________________

## 2019-01-08 ENCOUNTER — Encounter (HOSPITAL_COMMUNITY): Payer: Self-pay

## 2019-01-08 ENCOUNTER — Encounter (HOSPITAL_COMMUNITY)
Admission: RE | Admit: 2019-01-08 | Discharge: 2019-01-08 | Disposition: A | Discharge status: Home / Self Care | Payer: Medicare Other | Source: Ambulatory Visit | Attending: Urology | Admitting: Urology

## 2019-01-08 ENCOUNTER — Other Ambulatory Visit: Payer: Self-pay

## 2019-01-08 ENCOUNTER — Other Ambulatory Visit (HOSPITAL_COMMUNITY)
Admission: RE | Admit: 2019-01-08 | Discharge: 2019-01-08 | Disposition: A | Discharge status: Home / Self Care | Payer: Medicare Other | Source: Ambulatory Visit | Attending: Urology | Admitting: Urology

## 2019-01-08 DIAGNOSIS — Z87891 Personal history of nicotine dependence: Secondary | ICD-10-CM | POA: Insufficient documentation

## 2019-01-08 DIAGNOSIS — N261 Atrophy of kidney (terminal): Secondary | ICD-10-CM | POA: Insufficient documentation

## 2019-01-08 DIAGNOSIS — K219 Gastro-esophageal reflux disease without esophagitis: Secondary | ICD-10-CM | POA: Insufficient documentation

## 2019-01-08 DIAGNOSIS — Z20828 Contact with and (suspected) exposure to other viral communicable diseases: Secondary | ICD-10-CM | POA: Insufficient documentation

## 2019-01-08 DIAGNOSIS — Z8673 Personal history of transient ischemic attack (TIA), and cerebral infarction without residual deficits: Secondary | ICD-10-CM | POA: Insufficient documentation

## 2019-01-08 DIAGNOSIS — Z01812 Encounter for preprocedural laboratory examination: Secondary | ICD-10-CM | POA: Insufficient documentation

## 2019-01-08 HISTORY — DX: Tinnitus, unspecified ear: H93.19

## 2019-01-08 LAB — BASIC METABOLIC PANEL
Anion gap: 8 (ref 5–15)
BUN: 18 mg/dL (ref 8–23)
CO2: 26 mmol/L (ref 22–32)
Calcium: 9.2 mg/dL (ref 8.9–10.3)
Chloride: 106 mmol/L (ref 98–111)
Creatinine, Ser: 0.95 mg/dL (ref 0.61–1.24)
GFR calc Af Amer: >60 mL/min (ref >60)
GFR calc non Af Amer: >60 mL/min (ref >60)
Glucose, Bld: 99 mg/dL (ref 70–99)
Potassium: 4.2 mmol/L (ref 3.5–5.1)
Sodium: 140 mmol/L (ref 135–145)

## 2019-01-08 LAB — CBC
HCT: 40.1 % (ref 39.0–52.0)
Hemoglobin: 12.6 g/dL — ABNORMAL LOW (ref 13.0–17.0)
MCH: 30 pg (ref 26.0–34.0)
MCHC: 31.4 g/dL (ref 30.0–36.0)
MCV: 95.5 fL (ref 80.0–100.0)
Platelets: 253 10*3/uL (ref 150–400)
RBC: 4.2 MIL/uL — ABNORMAL LOW (ref 4.22–5.81)
RDW: 14 % (ref 11.5–15.5)
WBC: 6.6 10*3/uL (ref 4.0–10.5)
nRBC: 0 % (ref 0.0–0.2)

## 2019-01-08 LAB — PROTIME-INR
INR: 1 (ref 0.8–1.2)
Prothrombin Time: 13.2 seconds (ref 11.4–15.2)

## 2019-01-09 ENCOUNTER — Telehealth: Payer: Self-pay | Admitting: Family Medicine

## 2019-01-09 LAB — ABO/RH: ABO/RH(D): O POS

## 2019-01-09 LAB — NOVEL CORONAVIRUS, NAA (HOSP ORDER, SEND-OUT TO REF LAB; TAT 18-24 HRS): SARS-CoV-2, NAA: NOT DETECTED

## 2019-01-09 NOTE — Telephone Encounter (Signed)
Negative COVID results given. Patient results "NOT Detected." Caller expressed understanding. ° °

## 2019-01-11 ENCOUNTER — Ambulatory Visit (HOSPITAL_COMMUNITY): Payer: Medicare Other | Admitting: Certified Registered"

## 2019-01-11 ENCOUNTER — Other Ambulatory Visit: Payer: Self-pay

## 2019-01-11 ENCOUNTER — Encounter (HOSPITAL_COMMUNITY): Payer: Self-pay

## 2019-01-11 ENCOUNTER — Encounter (HOSPITAL_COMMUNITY)
Admission: RE | Disposition: A | Discharge status: Home / Self Care | Payer: Self-pay | Source: Home / Self Care | Attending: Urology

## 2019-01-11 ENCOUNTER — Ambulatory Visit (HOSPITAL_COMMUNITY): Payer: Medicare Other | Admitting: Physician Assistant

## 2019-01-11 ENCOUNTER — Inpatient Hospital Stay (HOSPITAL_COMMUNITY)
Admission: RE | Admit: 2019-01-11 | Discharge: 2019-01-12 | DRG: 660 | Disposition: A | Discharge status: Home / Self Care | Payer: Medicare Other | Attending: Urology | Admitting: Urology

## 2019-01-11 DIAGNOSIS — Z20828 Contact with and (suspected) exposure to other viral communicable diseases: Secondary | ICD-10-CM | POA: Diagnosis present

## 2019-01-11 DIAGNOSIS — N261 Atrophy of kidney (terminal): Secondary | ICD-10-CM | POA: Diagnosis present

## 2019-01-11 DIAGNOSIS — Z833 Family history of diabetes mellitus: Secondary | ICD-10-CM

## 2019-01-11 DIAGNOSIS — Z8673 Personal history of transient ischemic attack (TIA), and cerebral infarction without residual deficits: Secondary | ICD-10-CM

## 2019-01-11 DIAGNOSIS — N1330 Unspecified hydronephrosis: Secondary | ICD-10-CM | POA: Diagnosis present

## 2019-01-11 DIAGNOSIS — E871 Hypo-osmolality and hyponatremia: Secondary | ICD-10-CM | POA: Diagnosis not present

## 2019-01-11 DIAGNOSIS — Z8249 Family history of ischemic heart disease and other diseases of the circulatory system: Secondary | ICD-10-CM

## 2019-01-11 DIAGNOSIS — Q6239 Other obstructive defects of renal pelvis and ureter: Secondary | ICD-10-CM | POA: Diagnosis present

## 2019-01-11 DIAGNOSIS — Z87891 Personal history of nicotine dependence: Secondary | ICD-10-CM | POA: Diagnosis not present

## 2019-01-11 DIAGNOSIS — K219 Gastro-esophageal reflux disease without esophagitis: Secondary | ICD-10-CM | POA: Diagnosis present

## 2019-01-11 DIAGNOSIS — N135 Crossing vessel and stricture of ureter without hydronephrosis: Secondary | ICD-10-CM | POA: Diagnosis present

## 2019-01-11 HISTORY — PX: ROBOT ASSISTED LAPAROSCOPIC NEPHRECTOMY: SHX5140

## 2019-01-11 LAB — TYPE AND SCREEN
ABO/RH(D): O POS
Antibody Screen: NEGATIVE

## 2019-01-11 SURGERY — NEPHRECTOMY, RADICAL, ROBOT-ASSISTED, LAPAROSCOPIC, ADULT
Anesthesia: General | Site: Abdomen | Laterality: Left

## 2019-01-11 MED ORDER — HYDROMORPHONE HCL 1 MG/ML IJ SOLN
INTRAMUSCULAR | Status: AC
  Administered 2019-01-11: 0.25 mg via INTRAVENOUS
  Filled 2019-01-11: qty 1

## 2019-01-11 MED ORDER — EPHEDRINE 5 MG/ML INJ
INTRAVENOUS | Status: AC
  Filled 2019-01-11: qty 10

## 2019-01-11 MED ORDER — DIPHENHYDRAMINE HCL 12.5 MG/5ML PO ELIX: 12.5000 mg | ORAL_SOLUTION | Freq: Four times a day (QID) | ORAL | Status: DC | PRN

## 2019-01-11 MED ORDER — BUPIVACAINE HCL (PF) 0.5 % IJ SOLN
INTRAMUSCULAR | Status: AC
  Filled 2019-01-11: qty 30

## 2019-01-11 MED ORDER — FENTANYL CITRATE (PF) 250 MCG/5ML IJ SOLN
INTRAMUSCULAR | Status: AC
  Filled 2019-01-11: qty 5

## 2019-01-11 MED ORDER — PROMETHAZINE HCL 25 MG/ML IJ SOLN: 6.2500 mg | INTRAMUSCULAR | Status: DC | PRN

## 2019-01-11 MED ORDER — HEPARIN SODIUM (PORCINE) 5000 UNIT/ML IJ SOLN
5000.0000 [IU] | Freq: Three times a day (TID) | INTRAMUSCULAR | Status: DC
  Administered 2019-01-12: 5000 [IU] via SUBCUTANEOUS
  Filled 2019-01-11: qty 1

## 2019-01-11 MED ORDER — LACTATED RINGERS IR SOLN
Status: DC | PRN
  Administered 2019-01-11: 1000 mL

## 2019-01-11 MED ORDER — STERILE WATER FOR IRRIGATION IR SOLN
Status: DC | PRN
  Administered 2019-01-11: 1000 mL

## 2019-01-11 MED ORDER — LACTATED RINGERS IV SOLN
INTRAVENOUS | Status: DC
  Administered 2019-01-11 (×3): via INTRAVENOUS

## 2019-01-11 MED ORDER — TAMSULOSIN HCL 0.4 MG PO CAPS
0.4000 mg | ORAL_CAPSULE | Freq: Every day | ORAL | Status: DC
  Administered 2019-01-11 – 2019-01-12 (×2): 0.4 mg via ORAL
  Filled 2019-01-11 (×2): qty 1

## 2019-01-11 MED ORDER — CEFAZOLIN SODIUM-DEXTROSE 2-4 GM/100ML-% IV SOLN
INTRAVENOUS | Status: AC
  Filled 2019-01-11: qty 100

## 2019-01-11 MED ORDER — HYDROCODONE-ACETAMINOPHEN 5-325 MG PO TABS: 1.0000 | ORAL_TABLET | ORAL | 0 refills | Status: DC | PRN

## 2019-01-11 MED ORDER — CEFAZOLIN SODIUM-DEXTROSE 2-4 GM/100ML-% IV SOLN
2.0000 g | Freq: Once | INTRAVENOUS | Status: AC
  Administered 2019-01-11: 2 g via INTRAVENOUS

## 2019-01-11 MED ORDER — HYDROMORPHONE HCL 1 MG/ML IJ SOLN
0.2500 mg | INTRAMUSCULAR | Status: DC | PRN
  Administered 2019-01-11: 0.25 mg via INTRAVENOUS
  Administered 2019-01-11: 0.5 mg via INTRAVENOUS
  Administered 2019-01-11: 17:00:00 0.25 mg via INTRAVENOUS

## 2019-01-11 MED ORDER — DIPHENHYDRAMINE HCL 50 MG/ML IJ SOLN: 12.5000 mg | Freq: Four times a day (QID) | INTRAMUSCULAR | Status: DC | PRN

## 2019-01-11 MED ORDER — SODIUM CHLORIDE (PF) 0.9 % IJ SOLN
INTRAMUSCULAR | Status: AC
  Filled 2019-01-11: qty 50

## 2019-01-11 MED ORDER — BUPIVACAINE HCL (PF) 0.5 % IJ SOLN
INTRAMUSCULAR | Status: DC | PRN
  Administered 2019-01-11: 20 mL

## 2019-01-11 MED ORDER — ONDANSETRON HCL 4 MG/2ML IJ SOLN
INTRAMUSCULAR | Status: DC | PRN
  Administered 2019-01-11: 4 mg via INTRAVENOUS

## 2019-01-11 MED ORDER — MORPHINE SULFATE (PF) 2 MG/ML IV SOLN: 2.0000 mg | INTRAVENOUS | Status: DC | PRN

## 2019-01-11 MED ORDER — LIDOCAINE HCL 2 % IJ SOLN
INTRAMUSCULAR | Status: AC
  Filled 2019-01-11: qty 20

## 2019-01-11 MED ORDER — ONDANSETRON HCL 4 MG PO TABS: 4.0000 mg | ORAL_TABLET | Freq: Every day | ORAL | 1 refills | Status: AC | PRN

## 2019-01-11 MED ORDER — BRIMONIDINE TARTRATE 0.15 % OP SOLN
1.0000 [drp] | Freq: Three times a day (TID) | OPHTHALMIC | Status: DC
  Filled 2019-01-11: qty 5

## 2019-01-11 MED ORDER — FENTANYL CITRATE (PF) 250 MCG/5ML IJ SOLN
INTRAMUSCULAR | Status: DC | PRN
  Administered 2019-01-11 (×2): 50 ug via INTRAVENOUS
  Administered 2019-01-11 (×2): 100 ug via INTRAVENOUS
  Administered 2019-01-11: 50 ug via INTRAVENOUS

## 2019-01-11 MED ORDER — OXYCODONE HCL 5 MG PO TABS: 5.0000 mg | ORAL_TABLET | ORAL | Status: DC | PRN

## 2019-01-11 MED ORDER — DEXTROSE-NACL 5-0.45 % IV SOLN
INTRAVENOUS | Status: DC
  Administered 2019-01-11 – 2019-01-12 (×2): via INTRAVENOUS

## 2019-01-11 MED ORDER — BUPIVACAINE LIPOSOME 1.3 % IJ SUSP
20.0000 mL | Freq: Once | INTRAMUSCULAR | Status: AC
  Administered 2019-01-11: 20 mL
  Filled 2019-01-11: qty 20

## 2019-01-11 MED ORDER — LIDOCAINE 2% (20 MG/ML) 5 ML SYRINGE
INTRAMUSCULAR | Status: DC | PRN
  Administered 2019-01-11: 1.5 mg/kg/h via INTRAVENOUS

## 2019-01-11 MED ORDER — EPHEDRINE SULFATE-NACL 50-0.9 MG/10ML-% IV SOSY
PREFILLED_SYRINGE | INTRAVENOUS | Status: DC | PRN
  Administered 2019-01-11 (×2): 5 mg via INTRAVENOUS
  Administered 2019-01-11 (×2): 10 mg via INTRAVENOUS

## 2019-01-11 MED ORDER — ACETAMINOPHEN 325 MG PO TABS
650.0000 mg | ORAL_TABLET | ORAL | Status: DC | PRN
  Administered 2019-01-12 (×2): 650 mg via ORAL
  Filled 2019-01-11 (×2): qty 2

## 2019-01-11 MED ORDER — DEXAMETHASONE SODIUM PHOSPHATE 10 MG/ML IJ SOLN
INTRAMUSCULAR | Status: DC | PRN
  Administered 2019-01-11: 8 mg via INTRAVENOUS

## 2019-01-11 MED ORDER — PROPOFOL 10 MG/ML IV BOLUS
INTRAVENOUS | Status: DC | PRN
  Administered 2019-01-11: 140 mg via INTRAVENOUS

## 2019-01-11 MED ORDER — SUGAMMADEX SODIUM 200 MG/2ML IV SOLN
INTRAVENOUS | Status: DC | PRN
  Administered 2019-01-11: 200 mg via INTRAVENOUS

## 2019-01-11 MED ORDER — LIDOCAINE 2% (20 MG/ML) 5 ML SYRINGE
INTRAMUSCULAR | Status: DC | PRN
  Administered 2019-01-11: 40 mg via INTRAVENOUS

## 2019-01-11 MED ORDER — DOCUSATE SODIUM 100 MG PO CAPS
100.0000 mg | ORAL_CAPSULE | Freq: Two times a day (BID) | ORAL | Status: DC
  Administered 2019-01-11 – 2019-01-12 (×2): 100 mg via ORAL
  Filled 2019-01-11 (×2): qty 1

## 2019-01-11 MED ORDER — ROCURONIUM BROMIDE 10 MG/ML (PF) SYRINGE
PREFILLED_SYRINGE | INTRAVENOUS | Status: DC | PRN
  Administered 2019-01-11: 30 mg via INTRAVENOUS
  Administered 2019-01-11: 70 mg via INTRAVENOUS

## 2019-01-11 MED ORDER — CEFAZOLIN SODIUM-DEXTROSE 1-4 GM/50ML-% IV SOLN
1.0000 g | Freq: Three times a day (TID) | INTRAVENOUS | Status: AC
  Administered 2019-01-11 – 2019-01-12 (×2): 1 g via INTRAVENOUS
  Filled 2019-01-11 (×2): qty 50

## 2019-01-11 MED ORDER — SENNOSIDES-DOCUSATE SODIUM 8.6-50 MG PO TABS: 1.0000 | ORAL_TABLET | Freq: Every evening | ORAL | Status: DC | PRN

## 2019-01-11 MED ORDER — DOCUSATE SODIUM 100 MG PO CAPS: 100.0000 mg | ORAL_CAPSULE | Freq: Two times a day (BID) | ORAL | 0 refills | Status: AC | PRN

## 2019-01-11 SURGICAL SUPPLY — 52 items
BAG LAPAROSCOPIC 12 15 PORT 16 (BASKET) ×1 IMPLANT
BAG RETRIEVAL 12/15 (BASKET) ×2
CHLORAPREP W/TINT 26 (MISCELLANEOUS) ×2 IMPLANT
CLIP VESOLOCK LG 6/CT PURPLE (CLIP) ×4 IMPLANT
CLIP VESOLOCK MED LG 6/CT (CLIP) ×2 IMPLANT
CLIP VESOLOCK XL 6/CT (CLIP) ×2 IMPLANT
COVER SURGICAL LIGHT HANDLE (MISCELLANEOUS) ×2 IMPLANT
COVER TIP SHEARS 8 DVNC (MISCELLANEOUS) ×1 IMPLANT
COVER TIP SHEARS 8MM DA VINCI (MISCELLANEOUS) ×1
COVER WAND RF STERILE (DRAPES) IMPLANT
CUTTER ECHEON FLEX ENDO 45 340 (ENDOMECHANICALS) ×2 IMPLANT
DECANTER SPIKE VIAL GLASS SM (MISCELLANEOUS) ×2 IMPLANT
DERMABOND ADVANCED (GAUZE/BANDAGES/DRESSINGS) ×1
DERMABOND ADVANCED .7 DNX12 (GAUZE/BANDAGES/DRESSINGS) ×1 IMPLANT
DRAPE ARM DVNC X/XI (DISPOSABLE) ×4 IMPLANT
DRAPE COLUMN DVNC XI (DISPOSABLE) ×1 IMPLANT
DRAPE DA VINCI XI ARM (DISPOSABLE) ×4
DRAPE DA VINCI XI COLUMN (DISPOSABLE) ×1
DRAPE INCISE IOBAN 66X45 STRL (DRAPES) ×2 IMPLANT
DRAPE SHEET LG 3/4 BI-LAMINATE (DRAPES) ×2 IMPLANT
ELECT PENCIL ROCKER SW 15FT (MISCELLANEOUS) ×2 IMPLANT
ELECT REM PT RETURN 15FT ADLT (MISCELLANEOUS) ×2 IMPLANT
GLOVE BIO SURGEON STRL SZ 6.5 (GLOVE) IMPLANT
GLOVE BIOGEL M STRL SZ7.5 (GLOVE) IMPLANT
GLOVE BIOGEL PI IND STRL 7.5 (GLOVE) ×1 IMPLANT
GLOVE BIOGEL PI IND STRL 8 (GLOVE) ×1 IMPLANT
GLOVE BIOGEL PI INDICATOR 7.5 (GLOVE) ×1
GLOVE BIOGEL PI INDICATOR 8 (GLOVE) ×1
GOWN STRL REUS W/TWL XL LVL3 (GOWN DISPOSABLE) ×8 IMPLANT
HEMOSTAT SURGICEL 4X8 (HEMOSTASIS) ×2 IMPLANT
IRRIG SUCT STRYKERFLOW 2 WTIP (MISCELLANEOUS) ×2
IRRIGATION SUCT STRKRFLW 2 WTP (MISCELLANEOUS) ×1 IMPLANT
KIT BASIN OR (CUSTOM PROCEDURE TRAY) ×2 IMPLANT
KIT TURNOVER KIT A (KITS) IMPLANT
NEEDLE INSUFFLATION 14GA 120MM (NEEDLE) ×2 IMPLANT
PROTECTOR NERVE ULNAR (MISCELLANEOUS) ×4 IMPLANT
SEAL CANN UNIV 5-8 DVNC XI (MISCELLANEOUS) ×3 IMPLANT
SEAL XI 5MM-8MM UNIVERSAL (MISCELLANEOUS) ×3
SET TUBE SMOKE EVAC HIGH FLOW (TUBING) ×2 IMPLANT
SOLUTION ELECTROLUBE (MISCELLANEOUS) ×2 IMPLANT
STAPLE RELOAD 45 WHT (STAPLE) ×3 IMPLANT
STAPLE RELOAD 45MM WHITE (STAPLE) ×3
SUT MNCRL AB 4-0 PS2 18 (SUTURE) ×4 IMPLANT
SUT PDS AB 0 CT1 36 (SUTURE) ×4 IMPLANT
SUT VICRYL 0 UR6 27IN ABS (SUTURE) ×4 IMPLANT
TOWEL OR 17X26 10 PK STRL BLUE (TOWEL DISPOSABLE) ×2 IMPLANT
TOWEL OR NON WOVEN STRL DISP B (DISPOSABLE) ×2 IMPLANT
TRAY FOL W/BAG SLVR 16FR STRL (SET/KITS/TRAYS/PACK) ×1 IMPLANT
TRAY FOLEY W/BAG SLVR 16FR LF (SET/KITS/TRAYS/PACK) ×1
TRAY LAPAROSCOPIC (CUSTOM PROCEDURE TRAY) ×2 IMPLANT
TROCAR BLADELESS OPT 5 100 (ENDOMECHANICALS) IMPLANT
TROCAR XCEL 12X100 BLDLESS (ENDOMECHANICALS) ×2 IMPLANT

## 2019-01-11 NOTE — H&P (Signed)
Urology Preoperative H&P   Chief Complaint: Left UPJ obstruction   History of Present Illness: Tyler Robbins is a 73 y.o. 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  MAG3 renal scans from 2019 showed a differential function of LEFT 20-30%, RIGHT 70-80%.  We have discussed the various treatment options to address his UPJO and the patient initially decided to proceed with observation of the issue.  In August of this year, the patient had an episode of gross hematuria.  Subsequent CT scan showed worsening of his hydronephrosis with concomitant thinning and atrophy of the left renal parenchyma.  After much discussion, the patient has elected to proceed with a nephrectomy instead of a pyeloplasty.   Today, he denies flank pain or interval episodes of gross hematuria.     Past Medical History:  Diagnosis Date  . Anxiety   . Arthritis   . GERD (gastroesophageal reflux disease)   . Neuromuscular disorder ([place])   . Stroke ([place])   . Tinnitus    From years of being in the [employer]  . Ulcer     Past Surgical History:  Procedure Laterality Date  . Laser Ablation R [facility] 09/20/10  09/20/10    Allergies:  Allergies  Allergen Reactions  . Ketek [Telithromycin]   . Latex Dermatitis  . Tussi-12 [Chlorphen Tan-Carbetapent Tan]     Family History  Problem Relation Age of Onset  . Heart disease Father   . Diabetes Son     Social History:  reports that he quit smoking about 46 years ago. He has never used smokeless tobacco. He reports that he does not drink alcohol or use drugs.  ROS: A complete review of systems was performed.  All systems are negative except for pertinent findings as noted.  Physical Exam:  Vital signs in last 24 hours: Temp:  [98.6 F (37 C)] 98.6 F (37 C) (11/06 1121) Pulse Rate:  [63] 63 (11/06  1121) Resp:  [18] 18 (11/06 1121) BP: (146)/(98) 146/98 (11/06 1121) SpO2:  [98 %] 98 % (11/06 1121) Weight:  [73 kg] 73 kg (11/06 1128) Constitutional:  Alert and oriented, No acute distress Cardiovascular: Regular rate and rhythm, No JVD Respiratory: Normal respiratory effort, Lungs clear bilaterally GI: Abdomen is soft, nontender, nondistended, no abdominal masses GU: No CVA tenderness Lymphatic: No lymphadenopathy Neurologic: Grossly intact, no focal deficits Psychiatric: Normal mood and affect  Laboratory Data:  Recent Labs    01/08/19 1312  WBC 6.6  HGB 12.6*  HCT 40.1  PLT 253    Recent Labs    01/08/19 1312  NA 140  K 4.2  CL 106  GLUCOSE 99  BUN 18  CALCIUM 9.2  CREATININE 0.95     No results found for this or any previous visit (from the past 24 hour(s)). Recent Results (from the past 240 hour(s))  Novel Coronavirus, NAA (Hosp order, Send-out to Ref Lab; TAT 18-24 hrs     Status: None   Collection Time: 01/08/19  2:58 PM   Specimen: Nasopharyngeal Swab; Respiratory  Result Value Ref Range Status   SARS-CoV-2, NAA NOT DETECTED NOT DETECTED Final    Comment: (NOTE) This nucleic acid amplification test was developed and its performance characteristics determined by [employer]. Nucleic acid amplification tests include PCR and TMA. This test has not been FDA cleared or approved. This  test has been authorized by FDA under an Emergency Use Authorization (EUA). This test is only authorized for the duration of time the declaration that circumstances exist justifying the authorization of the emergency use of in vitro diagnostic tests for detection of SARS-CoV-2 virus and/or diagnosis of COVID-19 infection under section 564(b)(1) of the Act, 21 U.S.C. [id number]) (1), unless the authorization is terminated or revoked sooner. When diagnostic testing is negative, the possibility of a false negative result should be considered in the context of a  patient's recent exposures and the presence of clinical signs and symptoms consistent with COVID-19. An individual without symptoms of COVID- 19 and who is not shedding SARS-CoV-2 vi rus would expect to have a negative (not detected) result in this assay. Performed At: [facility] [address] [place], [place] [id number] Rush Farmer MD [id number]    [place]  Final    Comment: Performed at [facility], [place] [address] [place], [place] [address]    Renal Function: Recent Labs    01/08/19 1312  CREATININE 0.95   Estimated Creatinine Clearance: 64.7 mL/min (by C-G formula based on SCr of 0.95 mg/dL).  Radiologic Imaging: No results found.  I independently reviewed the above imaging studies.  Assessment and Plan Tyler Robbins is a 73 y.o. 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 His left kidney continues to atrophy due to his high-grade left UPJ obstruction. The patient and I had a long discussion about his treatment options, which include continued observation, left-sided pyeloplasty and left simple nephrectomy. Given the diminished function of the left kidney on recent MAG3 renal scans along with evidence of pronounced cortical thinning and atrophy, performing a left-sided pyeloplasty would only gain him marginal improvement in his overall renal function and could be technically challenging due to the redundancy of his left renal pelvis. The risk, benefits and alternatives of robot-assisted LEFT simple nephrectomy was discussed with the patient. Risks include, but are not limited to bleeding, intra-abdominal infection, adjacent organ injury, chronic pain, the need for blood transfusion, development of CKD, MI, CVA, PE, DVT and the inherent risk of general anesthesia. The patient voices understanding and wishes to proceed.    Ellison Hughs, MD 01/11/2019, 12:22  PM  [facility] Pager: [phone]

## 2019-01-11 NOTE — Anesthesia Procedure Notes (Signed)
Procedure Name: Intubation Date/Time: 01/11/2019 1:13 PM Performed by: Eben Burow, CRNA Pre-anesthesia Checklist: Patient identified, Emergency Drugs available, Suction available, Patient being monitored and Timeout performed Patient Re-evaluated:Patient Re-evaluated prior to induction Oxygen Delivery Method: Circle system utilized Preoxygenation: Pre-oxygenation with 100% oxygen Induction Type: IV induction Ventilation: Mask ventilation without difficulty Laryngoscope Size: Mac and 4 Grade View: Grade II Tube type: Oral Tube size: 7.5 mm Number of attempts: 1 Airway Equipment and Method: Stylet Placement Confirmation: ETT inserted through vocal cords under direct vision,  positive ETCO2 and breath sounds checked- equal and bilateral Secured at: 23 cm Tube secured with: Tape Dental Injury: Teeth and Oropharynx as per pre-operative assessment

## 2019-01-11 NOTE — Anesthesia Preprocedure Evaluation (Signed)
Anesthesia Evaluation  Patient identified by MRN, date of birth, ID band Patient awake    Reviewed: Allergy & Precautions, NPO status , Patient's Chart, lab work & pertinent test results  Airway Mallampati: II  TM Distance: >3 FB Neck ROM: Full    Dental no notable dental hx.    Pulmonary neg pulmonary ROS, former smoker,    Pulmonary exam normal breath sounds clear to auscultation       Cardiovascular negative cardio ROS Normal cardiovascular exam Rhythm:Regular Rate:Normal     Neuro/Psych Anxiety CVA    GI/Hepatic Neg liver ROS, GERD  ,  Endo/Other  negative endocrine ROS  Renal/GU negative Renal ROS  negative genitourinary   Musculoskeletal negative musculoskeletal ROS (+)   Abdominal   Peds negative pediatric ROS (+)  Hematology negative hematology ROS (+)   Anesthesia Other Findings   Reproductive/Obstetrics negative OB ROS                             Anesthesia Physical Anesthesia Plan  ASA: II  Anesthesia Plan: General   Post-op Pain Management:    Induction: Intravenous  PONV Risk Score and Plan: 2 and Ondansetron, Dexamethasone and Treatment may vary due to age or medical condition  Airway Management Planned: Oral ETT  Additional Equipment:   Intra-op Plan:   Post-operative Plan: Extubation in OR  Informed Consent: I have reviewed the patients History and Physical, chart, labs and discussed the procedure including the risks, benefits and alternatives for the proposed anesthesia with the patient or authorized representative who has indicated his/her understanding and acceptance.     Dental advisory given  Plan Discussed with: CRNA and Surgeon  Anesthesia Plan Comments:         Anesthesia Quick Evaluation

## 2019-01-11 NOTE — Op Note (Addendum)
Operative Note  Preoperative diagnosis:  1.  Left UPJ obstruction with severely hydronephrotic and atrophic left kidney  Postoperative diagnosis: 1.  Left UPJ obstruction with severely hydronephrotic atrophic left kidney  Procedure(s): 1.  Robot-assisted laparoscopic left simple nephrectomy (adrenal sparing)  Surgeon: Ellison Hughs, MD  Assistants:  Sharlot Gowda, MD PGY4  Anesthesia:  General  Complications:  None  EBL: 50 mL  Specimens: 1.  Left kidney  Drains/Catheters: 1.  Foley catheter  Intraoperative findings:   1. The left renal hilum was hemostatic following staple ligation  Indication:  Tyler Robbins is a 72 y.o. 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  MAG3 renal scans from 2019 showed a differential function of LEFT 20-30%, RIGHT 70-80%.  We have discussed the various treatment options to address his UPJO and the patient initially decided to proceed with observation of the issue.  In August of this year, the patient had an episode of gross hematuria.  Subsequent CT scan showed worsening of his hydronephrosis with concomitant thinning and atrophy of the left renal parenchyma.  After much discussion, the patient has elected to proceed with a nephrectomy instead of a pyeloplasty.  The patient has been consented for the above procedures, voices understanding and wishes to proceed.  Description of procedure:  After informed consent was obtained, the patient was brought to the operating room and general endotracheal anesthesia was administered.  The patient was then placed in the right lateral decubitus position and prepped and draped in usual sterile fashion.  A timeout was performed.  An 8 mm incision was then made lateral to the left rectus muscle at the level of the left 12th rib.  Abdominal  access was obtained via a Veress needle.  The abdominal cavity was then insufflated up to 15 mmHg.  An 8 mm port was then introduced into the abdominal cavity.  Inspection of the port entry site by the robotic camera revealed no adjacent organ injury.  We then placed 2 additional 8 mm robotic ports to triangulate the left renal hilum.  A 12 mm assistant port was then placed between the carmera port and 3rd (inferior) robotic arm.  The white line of Toldt along the descending colon was incised sharply and the colon, along with its mesocolonic fat, was reflected medially until the aorta was identified.  We then made a small window adjacent to the lower pole of the left kidney, identifying the left psoas muscle, left ureter and left gonadal vein.  The left ureter and gonadal vein were then reflected anteriorly allowing [place] to then incised the perihilar attachments using electrocautery.  We encountered a small lumbar vein adjacent to the insertion of the left gonadal vein into the left renal vein.  This lumbar vein was ligated with hemo-lock clips in 2 places and incised sharply.  This provided [place] excellent exposure to the left renal hilum.    A 45 mm endovascular stapler was then used to ligate the left renal artery and then the left renal vein, achieving excellent hemostasis.  The remaining peri-renal attachments were then excised using accommodation of blunt dissection and electrocautery.  The left adrenal gland was spared.  The endovascular stapler was then used to ligate the left gonadal vein and left ureter.  Once the kidney was freely mobile, it was placed in Endo Catch bag to be  be retrieved at the conclusion of the case.  The robot was then de-docked.  A left lower quadrant Gibson incision was then made and the mass was removed within the Endo Catch bag.  The fascia within the midline assistant port was then closed using an interrupted 0 Vicryl suture.  The fascia of the internal and external oblique was  then closed using a 0 PDS suture in a running fashion.  The subcutaneous tissue within the [facility] incision was then closed using a running 0 Vicryl suture.  All skin incisions were then closed using 4-0 Monocryl and then dressed with Dermabond.  The patient tolerated the procedure well and was transferred to the postanesthesia in stable condition.    Plan:  Monitor on the floor overnight.  Remove Foley catheter tomorrow morning.  Likely home on POD 1 or 2.

## 2019-01-11 NOTE — Transfer of Care (Signed)
Immediate Anesthesia Transfer of Care Note  Patient: Tyler Robbins  Procedure(s) Performed: XI ROBOTIC ASSISTED LAPAROSCOPIC SIMPLE NEPHRECTOMY (Left Abdomen)  Patient Location: PACU  Anesthesia Type:General  Level of Consciousness: drowsy  Airway & Oxygen Therapy: Patient Spontanous Breathing and Patient connected to face mask  Post-op Assessment: Report given to RN and Post -op Vital signs reviewed and stable  Post vital signs: Reviewed and stable  Last Vitals:  Vitals Value Taken Time  BP 140/80 01/11/19 1555  Temp    Pulse 56 01/11/19 1559  Resp 13 01/11/19 1559  SpO2 100 % 01/11/19 1559  Vitals shown include unvalidated device data.  Last Pain:  Vitals:   01/11/19 1128  TempSrc:   PainSc: 0-No pain         Complications: No apparent anesthesia complications

## 2019-01-12 LAB — BASIC METABOLIC PANEL
Anion gap: 11 (ref 5–15)
Anion gap: 12 (ref 5–15)
BUN: 13 mg/dL (ref 8–23)
BUN: 13 mg/dL (ref 8–23)
CO2: 21 mmol/L — ABNORMAL LOW (ref 22–32)
CO2: 23 mmol/L (ref 22–32)
Calcium: 8.3 mg/dL — ABNORMAL LOW (ref 8.9–10.3)
Calcium: 8.5 mg/dL — ABNORMAL LOW (ref 8.9–10.3)
Chloride: 94 mmol/L — ABNORMAL LOW (ref 98–111)
Chloride: 94 mmol/L — ABNORMAL LOW (ref 98–111)
Creatinine, Ser: 0.9 mg/dL (ref 0.61–1.24)
Creatinine, Ser: 1.03 mg/dL (ref 0.61–1.24)
GFR calc Af Amer: >60 mL/min (ref >60)
GFR calc Af Amer: >60 mL/min (ref >60)
GFR calc non Af Amer: >60 mL/min (ref >60)
GFR calc non Af Amer: >60 mL/min (ref >60)
Glucose, Bld: 197 mg/dL — ABNORMAL HIGH (ref 70–99)
Glucose, Bld: 172 mg/dL — ABNORMAL HIGH (ref 70–99)
Potassium: 3.9 mmol/L (ref 3.5–5.1)
Potassium: 3.8 mmol/L (ref 3.5–5.1)
Sodium: 126 mmol/L — ABNORMAL LOW (ref 135–145)
Sodium: 129 mmol/L — ABNORMAL LOW (ref 135–145)

## 2019-01-12 LAB — HEMOGLOBIN AND HEMATOCRIT, BLOOD
HCT: 35.8 % — ABNORMAL LOW (ref 39.0–52.0)
Hemoglobin: 11.9 g/dL — ABNORMAL LOW (ref 13.0–17.0)

## 2019-01-12 MED ORDER — CHLORHEXIDINE GLUCONATE CLOTH 2 % EX PADS: 6.0000 | MEDICATED_PAD | Freq: Every day | CUTANEOUS | Status: DC

## 2019-01-12 MED ORDER — BISACODYL 10 MG RE SUPP
10.0000 mg | Freq: Once | RECTAL | Status: AC
  Administered 2019-01-12: 10 mg via RECTAL
  Filled 2019-01-12: qty 1

## 2019-01-12 NOTE — Discharge Instructions (Signed)

## 2019-01-12 NOTE — Plan of Care (Signed)
Discharge instructions reviewed with patient, questions answered, verbalized understanding.  Awaiting wife for transportation home.

## 2019-01-12 NOTE — Discharge Summary (Signed)
Physician Discharge Summary  Patient ID: Tyler Robbins MRN: [id number] DOB/AGE: 05/08/1945 73 y.o.  Admit date: 01/11/2019 Discharge date: 01/12/2019  Admission Diagnoses:  Discharge Diagnoses:  Active Problems:   UPJ (ureteropelvic junction) obstruction   Discharged Condition: good  Hospital Course: 73 year old male underwent a robotic left simple nephrectomy for poorly functioning kidney with UPJ obstruction.  Patient tolerated the procedure well and was stable postoperatively.  The following day he had some mild hyponatremia but this improved with a repeat BMP.  It was still low but he was asymptomatic and this felt to be secondary to fluid shifts from his surgery and D5 half-normal saline.  Otherwise, the patient was ambulating, tolerating diet, voiding and was ready for discharge.  Consults: None  Significant Diagnostic Studies: None  Treatments: surgery: As above  Discharge Exam: Blood pressure 133/76, pulse (!) 59, temperature 98.3 F (36.8 C), resp. rate 19, height 5\' 7"  (1.702 m), weight 73 kg, SpO2 97 %. General appearance: alert, no acute distress Adequate perfusion of extremities Unlabored respiration Abdomen soft, nontender, nondistended, incisions without evidence of infection and are clean dry and intact  Disposition:    Allergies as of 01/12/2019      Reactions   Ketek [telithromycin]    Latex Dermatitis   Tussi-12 [chlorphen Tan-carbetapent Tan]       Medication List    TAKE these medications   aspirin 81 MG EC tablet Take 81 mg by mouth daily.   docusate sodium 100 MG capsule Commonly known as: Colace Take 1 capsule (100 mg total) by mouth 2 (two) times daily as needed for mild constipation.   Flomax 0.4 MG Caps capsule Generic drug: tamsulosin Take 0.4 mg by mouth daily.   HYDROcodone-acetaminophen 5-325 MG tablet Commonly known as: Norco Take 1 tablet by mouth every 4 (four) hours as needed for moderate pain.   Lumify 0.025 %  Soln Generic drug: Brimonidine Tartrate Place 1 drop into both eyes every 8 (eight) hours.   multivitamin with minerals tablet Take 1 tablet by mouth daily.   ondansetron 4 MG tablet Commonly known as: Zofran Take 1 tablet (4 mg total) by mouth daily as needed for nausea or vomiting.      Follow-up Information    Ceasar Mons, MD On 01/25/2019.   Specialty: Urology Why: Follow-up at 10 AM Contact information: [address] [place] [address] [phone]           Signed: Marton Redwood, III 01/12/2019, 11:07 AM

## 2019-01-13 NOTE — Anesthesia Postprocedure Evaluation (Signed)
Anesthesia Post Note  Patient: Tyler Robbins  Procedure(s) Performed: XI ROBOTIC ASSISTED LAPAROSCOPIC SIMPLE NEPHRECTOMY (Left Abdomen)     Patient location during evaluation: PACU Anesthesia Type: General Level of consciousness: awake and alert Pain management: pain level controlled Vital Signs Assessment: post-procedure vital signs reviewed and stable Respiratory status: spontaneous breathing, nonlabored ventilation, respiratory function stable and patient connected to nasal cannula oxygen Cardiovascular status: blood pressure returned to baseline and stable Postop Assessment: no apparent nausea or vomiting Anesthetic complications: no    Last Vitals:  Vitals:   01/11/19 2200 01/12/19 1130  BP:  136/80  Pulse: (!) 59 62  Resp: 19 20  Temp:  36.4 C  SpO2: 97% 98%    Last Pain:  Vitals:   01/12/19 1130  TempSrc: Oral  PainSc: 0-No pain                 Haydon Kalmar S

## 2019-01-14 ENCOUNTER — Encounter (HOSPITAL_COMMUNITY): Payer: Self-pay | Admitting: Urology

## 2019-01-14 LAB — SURGICAL PATHOLOGY

## 2019-01-29 ENCOUNTER — Encounter (HOSPITAL_COMMUNITY): Payer: Medicare Other

## 2019-01-29 ENCOUNTER — Encounter (HOSPITAL_COMMUNITY): Payer: Self-pay

## 2019-07-10 DIAGNOSIS — M5136 Other intervertebral disc degeneration, lumbar region: Secondary | ICD-10-CM | POA: Insufficient documentation

## 2019-07-10 DIAGNOSIS — G8929 Other chronic pain: Secondary | ICD-10-CM | POA: Insufficient documentation

## 2019-07-10 DIAGNOSIS — M545 Low back pain, unspecified: Secondary | ICD-10-CM | POA: Insufficient documentation

## 2019-07-23 ENCOUNTER — Telehealth: Payer: Self-pay | Admitting: *Deleted

## 2019-07-23 NOTE — Telephone Encounter (Signed)
Mr. Barrozo is an established patient with history of endovenous laser ablation by Dr. Kellie Simmering and history of venous bleed in 2017.  He was last seen at [facility] by Dr. Scot Dock in August 2020.  Mr. Quam states last night a scab on the back of his leg came off and he experienced venous bleeding.   He and his wife were able to stop the bleeding at home by compression and he currently has a bandaide over it and no further bleeding has occurred.   He is calling to request sclerotherapy to the bleeding site as he has done in the past.  Told him that Estell Harpin RN (who does sclerotherapy injections) would call him next week when she returns to the office. Mr. Lysaght is scheduled for Nancy's next available appointment on June 7.  Reviewed with Mr. Obas how to stop venous bleeding if it should reoccur.  Answered his questions about activity level and exercise.

## 2019-08-06 ENCOUNTER — Ambulatory Visit (INDEPENDENT_AMBULATORY_CARE_PROVIDER_SITE_OTHER): Payer: Medicare Other

## 2019-08-06 ENCOUNTER — Other Ambulatory Visit: Payer: Self-pay

## 2019-08-06 DIAGNOSIS — I8393 Asymptomatic varicose veins of bilateral lower extremities: Secondary | ICD-10-CM | POA: Diagnosis not present

## 2019-08-06 NOTE — Progress Notes (Signed)
Treated pt's small spider/reticular veins with Asclera 1% administered with a 27g butterfly.  Patient received a total of 1.5 mL. Treated R leg only-back of lower leg/ankle. Pt had previously experienced a bleed from this area and has had it covered with bandaids for several weeks. Upon removal of bandaids a tick was found below area that had previously bled. Dr. Carlis Abbott removed tick with tweezers. Tick was intact and pt requested to take tick with him. Red area from location where tick was removed. We proceeded with sclero; anticipate good results. Pt given post procedure instructions both verbally and on handout. Additionally, pt given educational material on what to look for regarding tick bite and when to call PCP. Pt was very anxious and had many questions/concerns regarding sclero and tick bite. Pt called insurance company today and is hopeful his treatment will be covered, however, he is aware that he will be responsible for payment if they deny treatment.  Pt verbalized understanding on all the above. Will follow PRN.  Photos: Yes.    Compression stockings applied: Yes.

## 2019-08-12 ENCOUNTER — Ambulatory Visit: Payer: Medicare Other

## 2019-09-10 ENCOUNTER — Telehealth: Payer: Self-pay

## 2019-09-10 NOTE — Telephone Encounter (Signed)
Pt called with questions about a small "knot" that he feels is between where his tick was found/removed and where he had sclerotherapy. He states it is about a 1/4 inch in size and is soft. He denies any pain or tenderness. He noticed it a week or two ago. Explained to him that sometimes the vein treated can be firm after sclerotherapy.  He states where I injected is "smooth". Encouraged him to keep an eye on it and let us know if anything changes or worsens.

## 2019-09-12 ENCOUNTER — Encounter: Payer: Self-pay | Admitting: Vascular Surgery

## 2019-09-12 ENCOUNTER — Other Ambulatory Visit: Payer: Self-pay

## 2019-09-12 ENCOUNTER — Telehealth: Payer: Self-pay

## 2019-09-12 ENCOUNTER — Ambulatory Visit (INDEPENDENT_AMBULATORY_CARE_PROVIDER_SITE_OTHER): Payer: Medicare Other | Admitting: Vascular Surgery

## 2019-09-12 VITALS — BP 130/74 | HR 60 | Temp 98.1°F | Resp 18 | Ht 67.0 in | Wt 168.9 lb

## 2019-09-12 DIAGNOSIS — I872 Venous insufficiency (chronic) (peripheral): Secondary | ICD-10-CM | POA: Diagnosis not present

## 2019-09-12 NOTE — Progress Notes (Signed)
Patient name: Tyler Robbins MRN: [id number] DOB: [birth year] Sex: male  REASON FOR VISIT:   Wound check.  HPI:   Tyler Robbins is a pleasant 74 y.o. male who I had seen in August 2020 with chronic venous insufficiency.  The patient had some deep venous reflux on the right and also some superficial venous root reflux in the small saphenous vein on the left.  However he was asymptomatic.  He also had some spider veins which had bled.  He had CEAP C4c venous disease.  We discussed conservative measures at that time.  The patient underwent sclerotherapy on 08/06/2019.  He also had a small tick removed from his leg.  He comes in for a wound check.  He states that he noticed a small bump where he had sclerotherapy recently on his posterior right calf.  He denies pain associated with this.  He denies any aching pain in his legs.  He said no bleeding varicose veins.   Current Outpatient Medications  Medication Sig Dispense Refill  . aspirin 81 MG EC tablet Take 81 mg by mouth daily.     . Brimonidine Tartrate (LUMIFY) 0.025 % SOLN Place 1 drop into both eyes every 8 (eight) hours.    . docusate sodium (COLACE) 100 MG capsule Take 1 capsule (100 mg total) by mouth 2 (two) times daily as needed for mild constipation. 30 capsule 0  . Multiple Vitamins-Minerals (MULTIVITAMIN WITH MINERALS) tablet Take 1 tablet by mouth daily.      . ondansetron (ZOFRAN) 4 MG tablet Take 1 tablet (4 mg total) by mouth daily as needed for nausea or vomiting. 30 tablet 1  . tamsulosin (FLOMAX) 0.4 MG CAPS capsule Take 0.4 mg by mouth daily.     Marland Kitchen HYDROcodone-acetaminophen (NORCO) 5-325 MG tablet Take 1 tablet by mouth every 4 (four) hours as needed for moderate pain. (Patient not taking: Reported on 09/12/2019) 20 tablet 0   No current facility-administered medications for this visit.    REVIEW OF SYSTEMS:  [X]  denotes positive finding, [ ]  denotes negative finding Vascular    Leg swelling    Cardiac    Chest pain  or chest pressure:    Shortness of breath upon exertion:    Short of breath when lying flat:    Irregular heart rhythm:    Constitutional    Fever or chills:     PHYSICAL EXAM:   Vitals:   09/12/19 1328  BP: 130/74  Pulse: 60  Resp: 18  Temp: 98.1 F (36.7 C)  TempSrc: Temporal  SpO2: 96%  Weight: 168 lb 14.4 oz (76.6 kg)  Height: 5\' 7"  (1.702 m)    GENERAL: The patient is a well-nourished male, in no acute distress. The vital signs are documented above. CARDIOVASCULAR: There is a regular rate and rhythm. PULMONARY: There is good air exchange bilaterally without wheezing or rales. VASCULAR: He has a tiny reticular vein that is thrombosed adjacent to where he had some sclerotherapy. He does have multiple areas of varicose veins and also reticular veins and spider veins bilaterally.  DATA:   No new data  MEDICAL ISSUES:   CHRONIC VENOUS INSUFFICIENCY: This patient has a small thrombosed reticular vein.  I recommended warm compresses.  This is not tender.  I encouraged him to continue to elevate his legs daily.  We discussed compression stockings but he says he does not have any symptoms in his legs and would prefer not to wear these.  I encouraged him to  stay as active as possible.  I will see him back as needed.  Deitra Mayo [facility] [phone]

## 2019-09-16 NOTE — Telephone Encounter (Signed)
Opened in error

## 2020-08-28 ENCOUNTER — Other Ambulatory Visit: Payer: Self-pay

## 2020-08-28 DIAGNOSIS — I83813 Varicose veins of bilateral lower extremities with pain: Secondary | ICD-10-CM

## 2020-08-31 ENCOUNTER — Other Ambulatory Visit: Payer: Self-pay

## 2020-08-31 ENCOUNTER — Ambulatory Visit: Payer: Medicare Other | Admitting: Physician Assistant

## 2020-08-31 ENCOUNTER — Ambulatory Visit (HOSPITAL_COMMUNITY)
Admission: RE | Admit: 2020-08-31 | Discharge: 2020-08-31 | Disposition: A | Discharge status: Home / Self Care | Payer: Medicare Other | Source: Ambulatory Visit | Attending: Physician Assistant | Admitting: Physician Assistant

## 2020-08-31 VITALS — BP 116/71 | HR 63 | Temp 98.2°F | Resp 20 | Ht 67.0 in | Wt 168.1 lb

## 2020-08-31 DIAGNOSIS — I872 Venous insufficiency (chronic) (peripheral): Secondary | ICD-10-CM | POA: Diagnosis not present

## 2020-08-31 DIAGNOSIS — I83813 Varicose veins of bilateral lower extremities with pain: Secondary | ICD-10-CM | POA: Diagnosis not present

## 2020-08-31 NOTE — Progress Notes (Signed)
Office Note     CC:  follow up Requesting Provider:  Christain [place], MD  HPI: Tyler Robbins is a 75 y.o. (1945-12-04) male who presents for follow up of venous disease. He was last seen in July of 2021 by Dr. Scot Dock. At time he was there for a wound check after sclerotherapy in his right leg. He had sclerotherapy in June of 2021 on the right posterior calf in area where he had bleeding varicose vein. He previously was found to have some deep venous reflux of the right lower extremity and small saphenous vein reflux of the left lower extremity. He had not been symptomatic. He was not a candidate for ablation based on his prior duplex. Remote history in 2012 of previous laser ablation of left great saphenous vein and right small saphenous vein as well as sclerotherapy for bleeding episodes by Dr. Kellie Simmering.  He presents today with concerns about some reticular veins on dorsum of his left foot. He says more recently they have become very prominent and he is worried about bleeding. He has had bleeding from veins in the past in both legs. He denies any pain, bleeding, swelling, aching, tiredness, or ulceration. He does not wear compression but he does elevate  Past Medical History:  Diagnosis Date   Anxiety    Arthritis    GERD (gastroesophageal reflux disease)    Neuromuscular disorder (HCC)    Stroke ([place])    Tinnitus    From years of being in the [place]     Past Surgical History:  Procedure Laterality Date   Laser Ablation R SSV 09/20/10  09/20/10   ROBOT ASSISTED LAPAROSCOPIC NEPHRECTOMY Left 01/11/2019   Procedure: XI ROBOTIC ASSISTED LAPAROSCOPIC SIMPLE NEPHRECTOMY;  Surgeon: Ceasar Mons, MD;  Location: [facility];  Service: Urology;  Laterality: Left;    Social History   Socioeconomic History   Marital status: Married    Spouse name: Not on file   Number of children: Not on file   Years of education: Not on file   Highest education level: Not on file   Occupational History   Not on file  Tobacco Use   Smoking status: Former    Pack years: 0.00    Types: Cigarettes    Quit date: 03/16/1972    Years since quitting: 48.4   Smokeless tobacco: Never  Vaping Use   Vaping Use: Never used  Substance and Sexual Activity   Alcohol use: No   Drug use: Never   Sexual activity: Not on file  Other Topics Concern   Not on file  Social History Narrative   Not on file   Social Determinants of Health   Financial Resource Strain: Not on file  Food Insecurity: Not on file  Transportation Needs: Not on file  Physical Activity: Not on file  Stress: Not on file  Social Connections: Not on file  Intimate Partner Violence: Not on file   Family History  Problem Relation Age of Onset   Heart disease Father    Diabetes Son     Current Outpatient Medications  Medication Sig Dispense Refill   aspirin 81 MG EC tablet Take 81 mg by mouth daily.      Brimonidine Tartrate (LUMIFY) 0.025 % SOLN Place 1 drop into both eyes every 8 (eight) hours.     Multiple Vitamins-Minerals (MULTIVITAMIN WITH MINERALS) tablet Take 1 tablet by mouth daily.       tamsulosin (FLOMAX) 0.4 MG CAPS capsule  Take 0.4 mg by mouth daily.      No current facility-administered medications for this visit.    Allergies  Allergen Reactions   Ketek [Telithromycin]    Latex Dermatitis   Tussi-12 [Chlorphen Tan-Carbetapent Tan]      REVIEW OF SYSTEMS:  [X]  denotes positive finding, [ ]  denotes negative finding Cardiac  Comments:  Chest pain or chest pressure:    Shortness of breath upon exertion:    Short of breath when lying flat:    Irregular heart rhythm:        Vascular    Pain in calf, thigh, or hip brought on by ambulation:    Pain in feet at night that wakes you up from your sleep:     Blood clot in your veins:    Leg swelling:         Pulmonary    Oxygen at home:    Productive cough:     Wheezing:         Neurologic    Sudden weakness in arms or  legs:     Sudden numbness in arms or legs:     Sudden onset of difficulty speaking or slurred speech:    Temporary loss of vision in one eye:     Problems with dizziness:         Gastrointestinal    Blood in stool:     Vomited blood:         Genitourinary    Burning when urinating:     Blood in urine:        Psychiatric    Major depression:         Hematologic    Bleeding problems:    Problems with blood clotting too easily:        Skin    Rashes or ulcers:        Constitutional    Fever or chills:      PHYSICAL EXAMINATION:  Vitals:   08/31/20 1512  BP: 116/71  Pulse: 63  Resp: 20  Temp: 98.2 F (36.8 C)  TempSrc: Temporal  SpO2: 97%  Weight: 168 lb 1.6 oz (76.2 kg)  Height: 5\' 7"  (1.702 m)    General:  WDWN in NAD; vital signs documented above Gait: Normal HENT: WNL, normocephalic Pulmonary: normal non-labored breathing  Cardiac: regular HR Extremities:2+ DP and PT pulses bilaterally, feet warm and well perfused. Bilaterally he has reticular and spider veins especially on dorsum of left foot. No edema or swelling   Musculoskeletal: no muscle wasting or atrophy  Neurologic: A&O X 3;  No focal weakness or paresthesias are detected Psychiatric:  The pt has Normal affect.   Non-Invasive Vascular Imaging:   +----------------------+---------+------+-----------+-----------+----------  ----+  LEFT                  Reflux NoRefluxReflux Time Diameter  Comments                                          Yes                 cms                      +----------------------+---------+------+-----------+-----------+----------  ----+  CFV                   no                                                     +----------------------+---------+------+-----------+-----------+----------  ----+  FV mid                no                                                      +----------------------+---------+------+-----------+-----------+----------  ----+  Popliteal                       yes   >1 second                              +----------------------+---------+------+-----------+-----------+----------  ----+  GSV at [facility]                                                 not  visualized  +----------------------+---------+------+-----------+-----------+----------  ----+  GSV prox thigh                                             not  visualized  +----------------------+---------+------+-----------+-----------+----------  ----+  GSV mid thigh         no                           0.16                      +----------------------+---------+------+-----------+-----------+----------  ----+  GSV dist thigh        no                           0.21                      +----------------------+---------+------+-----------+-----------+----------  ----+  GSV at knee           no                           0.19                      +----------------------+---------+------+-----------+-----------+----------  ----+  GSV prox calf                   yes    >500 ms     0.53                      +----------------------+---------+------+-----------+-----------+----------  ----+  SSV Pop Fossa         no                           0.16                      +----------------------+---------+------+-----------+-----------+----------  ----+  SSV prox calf                   yes    >500 ms     0.31                      +----------------------+---------+------+-----------+-----------+----------  ----+  SSV mid calf                    yes    >500 ms     0.20                      +----------------------+---------+------+-----------+-----------+----------  ----+  Anterior accessory              yes    >500 ms                               vein                                                                          +----------------------+---------+------+-----------+-----------+----------     ASSESSMENT/PLAN:: 75 y.o. male here for follow up for venous disease. He has some concern regarding some reticular veins on dorsum of left foot. He is worried about potential bleeding as he has had bleeding from varicose veins in both legs in the past. He is not having any symptoms from the varicose veins. His duplex today shows continued superficial reflux but he is not candidate for any further intervention. I would recommend sclerotherapy treatment as he is concerned about bleeding. I recommended keeping his feet protected, elevating his feet and wearing compression stockings. I discussed with him sclerotherapy is considered cosmetic and will be an out of pocket procedure. I provided him with RN Ernesta Amble card and he can call and schedule with her at her earliest convenience. He otherwise can follow up as needed    Karoline Caldwell, PA-C [facility] [phone]  Clinic MD:   Trula Slade

## 2020-09-11 ENCOUNTER — Other Ambulatory Visit: Payer: Self-pay

## 2020-09-11 ENCOUNTER — Ambulatory Visit (INDEPENDENT_AMBULATORY_CARE_PROVIDER_SITE_OTHER): Payer: Medicare Other

## 2020-09-11 DIAGNOSIS — I8393 Asymptomatic varicose veins of bilateral lower extremities: Secondary | ICD-10-CM | POA: Diagnosis not present

## 2020-09-11 NOTE — Progress Notes (Signed)
Treated pt's LLE spider and small reticular veins with Asclera 1% administered with a 27g butterfly.  Patient received a total of 2 mL. Did pt's RLE sclerotherapy in the past with good outcome. Pt was very anxious and repeatedly asking the same questions many times. We reviewed the post procedure care instructions several times both verbally and handout. Pt will follow up PRN.   Photos: Yes.    Compression stockings applied: Yes.

## 2020-09-18 ENCOUNTER — Telehealth: Payer: Self-pay

## 2020-09-18 NOTE — Telephone Encounter (Signed)
Returned pt's call regarding a few questions he had about sclerotherapy after care. He is still wearing his compression stockings during the day and stated the area treated looks good. No further questions/concerns at this time.

## 2021-07-12 ENCOUNTER — Ambulatory Visit (INDEPENDENT_AMBULATORY_CARE_PROVIDER_SITE_OTHER): Payer: Medicare Other

## 2021-07-12 DIAGNOSIS — I8393 Asymptomatic varicose veins of bilateral lower extremities: Secondary | ICD-10-CM

## 2021-07-12 NOTE — Progress Notes (Signed)
Treated pt's BLE ankle area spider and small reticular veins with Aslcera 1% administered with a 27g butterfly.  Patient received a total of 2 mL. Pt tolerated well. Pt had good results from his last treatment and wanted to be proactive. He has no bleeds since I last saw him. He had a tick on RLE shin area that I removed full intact. Tick was still alive and appeared to have not been there for very long. Encouraged pt to have his wife check his body often, as I found a tick on him a couple of years ago when he was in for sclerotherapy. Pt wanted to take tick with him in case he needed to show his PCP. Pt asked the same questions about aftercare of sclerotherapy repeatedly. He was given these instructions both verbally and on handout. Will follow prn. ? ? ?Photos: Yes.    Compression stockings applied: Yes.   ? ? ? ? ? ? ? ?

## 2022-01-07 LAB — COLOGUARD: COLOGUARD: NEGATIVE

## 2022-01-07 LAB — EXTERNAL GENERIC LAB PROCEDURE: COLOGUARD: NEGATIVE

## 2022-01-17 ENCOUNTER — Ambulatory Visit: Payer: Medicare Other

## 2022-01-17 DIAGNOSIS — I8393 Asymptomatic varicose veins of bilateral lower extremities: Secondary | ICD-10-CM

## 2022-01-17 NOTE — Progress Notes (Signed)
Pt's small raised reticular/spider veins on BLE treated with Asclera 1%. Pt received one vial (2 mL). He tolerated very well. He had some skin color changes on BLE that he states have not worsened. He tripped on curb while running in parking lot at work Saturday and states he felt a pop in his knee and it has been sore since. I have advised him to call his PCP if this does not start to improve later this week. Pt had many questions about after care. We went over everything multiple times verbally and he also received a print out. Compression stockings were applied at end of treatment. Encouraged pt to make an appt with podiatry for nail trim. He thinks he has seen someone previously at [facility]. He was given their number to call them to schedule appt. Pt brought in tick that I had removed from his last treatment in May. He showed me the area of his leg that he thought tick had been on and it looked normal. Pt will call with any questions/concerns.

## 2022-01-26 ENCOUNTER — Ambulatory Visit: Payer: Medicare Other | Admitting: Podiatry

## 2022-01-26 ENCOUNTER — Encounter: Payer: Self-pay | Admitting: Podiatry

## 2022-01-26 DIAGNOSIS — B351 Tinea unguium: Secondary | ICD-10-CM | POA: Diagnosis not present

## 2022-01-26 DIAGNOSIS — M79674 Pain in right toe(s): Secondary | ICD-10-CM | POA: Diagnosis not present

## 2022-01-26 DIAGNOSIS — M79675 Pain in left toe(s): Secondary | ICD-10-CM

## 2022-01-29 NOTE — Progress Notes (Signed)
Subjective:   Patient ID: Tyler Robbins, male   DOB: 75 y.o.   MRN: [id number]   HPI Patient presents with elongated nailbeds 1-5 both feet that are thick and difficult for the patient to cut.  States that he does have some other issues which has made this difficult process.  Patient does not smoke likes to be active   Review of Systems  All other systems reviewed and are negative.       Objective:  Physical Exam Vitals and nursing note reviewed.  Constitutional:      Appearance: He is well-developed.  Pulmonary:     Effort: Pulmonary effort is normal.  Musculoskeletal:        General: Normal range of motion.  Skin:    General: Skin is warm.  Neurological:     Mental Status: He is alert.     Neurovascular status intact muscle strength adequate range of motion adequate with thick yellow brittle nailbeds 1-5 both feet that are painful and incurvated in the corners with patient having no ability to take care of     Assessment:  Chronic mycotic nail infection with pain 1-5 both feet     Plan:  Debridement painful nailbeds 1-5 both feet neurogenic bleeding reappoint for routine care or can get pedicures that may be able to address this adequately

## 2022-02-17 ENCOUNTER — Telehealth: Payer: Self-pay

## 2022-02-17 NOTE — Telephone Encounter (Signed)
Returned pt's call. Left VM to call back if he still needs assistance.

## 2022-12-05 ENCOUNTER — Telehealth: Payer: Self-pay

## 2022-12-05 NOTE — Telephone Encounter (Signed)
Pt called to schedule more sclerotherapy. This will need to get pre authorized and pt is going to bring in his new insurance card this week for [place] to scan.

## 2022-12-20 ENCOUNTER — Ambulatory Visit (INDEPENDENT_AMBULATORY_CARE_PROVIDER_SITE_OTHER): Payer: Self-pay

## 2022-12-20 DIAGNOSIS — I8393 Asymptomatic varicose veins of bilateral lower extremities: Secondary | ICD-10-CM

## 2022-12-20 NOTE — Progress Notes (Addendum)
Treated pt's BLE small reticular veins with Asclera 1%. MD came in prior to treatment to assess areas to be treated. Pt received a total of 2 mL/20 mg of Asclera 1%, administered with a 27 gauge butterfly needle. He tolerated well; easy access. Compression stockings applied at the end of treatment. Post procedure care instructions given on handout and verbally. Pt will call if he has any questions/concerns.

## 2023-04-21 ENCOUNTER — Telehealth: Payer: Self-pay

## 2023-04-21 NOTE — Telephone Encounter (Signed)
Attempted to return pt's call; his VM box is full. Unable to leave VM.

## 2023-05-03 ENCOUNTER — Ambulatory Visit (INDEPENDENT_AMBULATORY_CARE_PROVIDER_SITE_OTHER): Payer: Self-pay

## 2023-05-03 DIAGNOSIS — I8393 Asymptomatic varicose veins of bilateral lower extremities: Secondary | ICD-10-CM

## 2023-05-03 NOTE — Progress Notes (Signed)
 Treated pt's small spider and very small reticular veins on BLE with Asclera 1% administered with a 27 gauge butterfly needle. Pt received a total of 1.5 mL of Asclera 1%. APP came in to assess pt prior to start of treatment.  He tolerated well. He had very easy to access veins. He was placed in 20-30 mm Hg compression hose at the end of treatment and given post treatment care instructions on handout and verbally. He is plea       sed with previous treatments. He will call should he have any questions/concerns.

## 2023-08-15 ENCOUNTER — Ambulatory Visit: Attending: Vascular Surgery

## 2023-08-15 DIAGNOSIS — I8393 Asymptomatic varicose veins of bilateral lower extremities: Secondary | ICD-10-CM

## 2023-08-15 NOTE — Progress Notes (Signed)
 Treated pt's LLE spider and small reticular veins with Asclera 1% administered with a 27 gauge butterfly needle. Pt received a total of 2 ml/20 mg of Asclera 1%. Easy access, pt tolerated well. Pt was given post treatment care instructions on handout and verbal, and will call with any questions/concerns. Pt was placed in compression stocking at the end of treatment.
